# Patient Record
Sex: Female | Born: 1960
Health system: Southern US, Community
[De-identification: ages and names within clinical notes are randomized; demographics above are authoritative.]

## PROBLEM LIST (undated history)

## (undated) DIAGNOSIS — Z87442 Personal history of urinary calculi: Secondary | ICD-10-CM

## (undated) DIAGNOSIS — Z8489 Family history of other specified conditions: Secondary | ICD-10-CM

---

## 1993-07-16 HISTORY — PX: TUBAL LIGATION: SHX77

## 1994-07-16 HISTORY — PX: CHOLECYSTECTOMY: SHX55

## 2000-09-18 ENCOUNTER — Encounter: Payer: Self-pay | Admitting: *Deleted

## 2000-09-18 ENCOUNTER — Ambulatory Visit (HOSPITAL_COMMUNITY): Admission: RE | Admit: 2000-09-18 | Discharge: 2000-09-18 | Payer: Self-pay | Admitting: *Deleted

## 2001-05-08 ENCOUNTER — Ambulatory Visit (HOSPITAL_COMMUNITY): Admission: RE | Admit: 2001-05-08 | Discharge: 2001-05-08 | Payer: Self-pay | Admitting: *Deleted

## 2001-05-08 ENCOUNTER — Encounter: Payer: Self-pay | Admitting: *Deleted

## 2001-08-18 ENCOUNTER — Other Ambulatory Visit: Admission: RE | Admit: 2001-08-18 | Discharge: 2001-08-18 | Payer: Self-pay | Admitting: Obstetrics and Gynecology

## 2002-10-21 ENCOUNTER — Other Ambulatory Visit: Admission: RE | Admit: 2002-10-21 | Discharge: 2002-10-21 | Payer: Self-pay | Admitting: Obstetrics and Gynecology

## 2003-11-01 ENCOUNTER — Other Ambulatory Visit: Admission: RE | Admit: 2003-11-01 | Discharge: 2003-11-01 | Payer: Self-pay | Admitting: Obstetrics and Gynecology

## 2004-03-23 ENCOUNTER — Ambulatory Visit (HOSPITAL_COMMUNITY): Admission: RE | Admit: 2004-03-23 | Discharge: 2004-03-23 | Payer: Self-pay | Admitting: *Deleted

## 2004-03-23 ENCOUNTER — Encounter (INDEPENDENT_AMBULATORY_CARE_PROVIDER_SITE_OTHER): Payer: Self-pay | Admitting: Specialist

## 2004-03-28 ENCOUNTER — Encounter: Admission: RE | Admit: 2004-03-28 | Discharge: 2004-03-28 | Payer: Self-pay | Admitting: *Deleted

## 2004-04-13 ENCOUNTER — Ambulatory Visit (HOSPITAL_COMMUNITY): Admission: RE | Admit: 2004-04-13 | Discharge: 2004-04-13 | Payer: Self-pay | Admitting: Urology

## 2004-07-06 ENCOUNTER — Encounter: Admission: RE | Admit: 2004-07-06 | Discharge: 2004-07-06 | Payer: Self-pay | Admitting: Obstetrics and Gynecology

## 2004-12-15 ENCOUNTER — Other Ambulatory Visit: Admission: RE | Admit: 2004-12-15 | Discharge: 2004-12-15 | Payer: Self-pay | Admitting: Obstetrics and Gynecology

## 2005-01-01 ENCOUNTER — Encounter: Admission: RE | Admit: 2005-01-01 | Discharge: 2005-01-01 | Payer: Self-pay | Admitting: Obstetrics and Gynecology

## 2006-02-20 ENCOUNTER — Other Ambulatory Visit: Admission: RE | Admit: 2006-02-20 | Discharge: 2006-02-20 | Payer: Self-pay | Admitting: Obstetrics and Gynecology

## 2006-03-08 ENCOUNTER — Encounter: Admission: RE | Admit: 2006-03-08 | Discharge: 2006-03-08 | Payer: Self-pay | Admitting: Obstetrics and Gynecology

## 2007-03-13 ENCOUNTER — Emergency Department (HOSPITAL_COMMUNITY): Admission: EM | Admit: 2007-03-13 | Discharge: 2007-03-14 | Payer: Self-pay | Admitting: Emergency Medicine

## 2007-03-14 ENCOUNTER — Inpatient Hospital Stay (HOSPITAL_COMMUNITY): Admission: AD | Admit: 2007-03-14 | Discharge: 2007-03-17 | Payer: Self-pay | Admitting: *Deleted

## 2007-03-14 ENCOUNTER — Ambulatory Visit: Payer: Self-pay | Admitting: *Deleted

## 2007-04-10 ENCOUNTER — Ambulatory Visit (HOSPITAL_COMMUNITY): Payer: Self-pay | Admitting: *Deleted

## 2007-04-22 ENCOUNTER — Ambulatory Visit (HOSPITAL_COMMUNITY): Payer: Self-pay | Admitting: *Deleted

## 2008-06-16 ENCOUNTER — Encounter: Admission: RE | Admit: 2008-06-16 | Discharge: 2008-06-16 | Payer: Self-pay | Admitting: Obstetrics and Gynecology

## 2009-08-05 ENCOUNTER — Encounter: Admission: RE | Admit: 2009-08-05 | Discharge: 2009-08-05 | Payer: Self-pay | Admitting: Obstetrics and Gynecology

## 2010-07-16 HISTORY — PX: SHOULDER ARTHROSCOPY W/ SUBACROMIAL DECOMPRESSION AND DISTAL CLAVICLE EXCISION: SHX2401

## 2010-08-06 ENCOUNTER — Encounter: Payer: Self-pay | Admitting: Obstetrics and Gynecology

## 2010-10-24 ENCOUNTER — Other Ambulatory Visit: Payer: Self-pay | Admitting: Obstetrics and Gynecology

## 2010-10-24 DIAGNOSIS — Z1231 Encounter for screening mammogram for malignant neoplasm of breast: Secondary | ICD-10-CM

## 2010-11-03 ENCOUNTER — Ambulatory Visit
Admission: RE | Admit: 2010-11-03 | Discharge: 2010-11-03 | Disposition: A | Discharge status: Home / Self Care | Payer: BC Managed Care – PPO | Source: Ambulatory Visit | Attending: Obstetrics and Gynecology | Admitting: Obstetrics and Gynecology

## 2010-11-03 DIAGNOSIS — Z1231 Encounter for screening mammogram for malignant neoplasm of breast: Secondary | ICD-10-CM
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
## 2010-11-28 NOTE — Discharge Summary (Signed)
[name], [name]            ACCOUNT NO.:  [id number]   MEDICAL RECORD NO.:  [id number]          PATIENT TYPE:  EMS   LOCATION:  ED                           FACILITY[facility]   PHYSICIAN:  Jasmine Pang, M.D. DATE OF BIRTH:  05-Jun-1961   DATE OF ADMISSION:  03/13/2007  DATE OF DISCHARGE:  03/17/2007                               DISCHARGE SUMMARY   DISCHARGE DIAGNOSES:   AXIS I:  Psychotic disorder, not otherwise specified.   AXIS II:  None.   AXIS III:  Lactose intolerance.   AXIS IV:  Moderate.  (Burden of psychiatric illness, problems with  primary support group).   AXIS V:  Global Assessment of Functioning was 45 on discharge.  Global  Assessment of Functioning was 25 upon admission.  Global Assessment of  Functioning highest past year 75-80.   IDENTIFYING INFORMATION:  A 50 year old married white female who was  admitted on a voluntary basis on March 13, 2007.   HISTORY OF PRESENT ILLNESS:  The patient was brought to the ED by her  husband.  She had had 1 week of increasing paranoid behavior.  She was  concerned that the cable man was wiring the house.  She presented to the  ED complaining of a mental breakdown.  She endorses delusional  thinking for 5 days.  She felt the cable man had wired their house and  thought husband had an affair.  She also thought that husband had an  affair which was not the case.  She was not sleeping very well.  Appetite was decreased.  She noticed her thoughts were disorganized and  she had trouble completing sentences.  She was having loose  associations.  She does not have mental health history.  She denies any  stressors other than the worry that her husband might have been having  an affair.  She denies suicidal or homicidal ideation.  A CT scan was  negative.  She has been on Lexapro in the past by primary care  physician..  The patient denies any drug or alcohol abuse.  She does  have irritable bowel syndrome.  She has had a  bilateral tubal ligation.  She is not on any medications now.  She is allergic to amoxicillin,  Septra and codeine.   PHYSICAL EXAMINATION:  This was done in the ED prior to admission.  There were no acute physical problems.   On admission laboratories were done in the ED prior to admission, see  chart for the results.  No acute abnormalities noted.   HOSPITAL COURSE:  Upon admission the patient was given Risperdal 2 mg  p.o. at bedtime times one.  On March 14, 2007 she was started on  Risperdal 0.25 mg p.o. now then 0.25 mg p.o. q. a.m. and 0.5 mg p.o. at  bedtime.  She was also started on alprazolam 0.5 mg p.o. q.6h. p.r.n.  anxiety, as well as 0.5 mg p.o. at bedtime.  She was started on  benztropine 2 mg p.o. q.8h. p.r.n. EPS or dystonia.  A TSH was ordered  which was within normal limits.  Urinalysis was  negative.  Hepatic  function was remarkable for slightly elevated SGPT of 48 (0-35) and a  slightly increased indirect bilirubin of 1.1 (0.3-0.9).  Urine drug  screen was negative.  Basic metabolic panel was within normal limits  except for a slightly elevated glucose of 119.  Alcohol level was less  than 5.  The CBC with differential was remarkable for slightly elevated  WBC count of 11.2 (4-10.5) and an elevated platelet count of 475 (150-  400).  On March 15, 2007 the patient's Risperdal was changed to 1.5 mg  M tab p.o. q.6h. p.r.n. agitation and psychosis.  On March 16, 2007 the  patient was placed on one-to-one precautions for increasing psychosis  and confusion.  On March 16, 2007 Risperdal was increased to 0.5 mg  p.o. b.i.d. and 2 mg at bedtime.  Xanax was increased to 1 mg p.o. at  bedtime.  On March 17, 2007 one-to-one observation was discontinued  due to improvement in the patient's psychosis and confusion.  During the  hospitalization the patient was disorganized with fragmented thinking.  She had a lot of thought blocking.  She was also suspicious that others   were talking about her.  She was depressed and anxious.  She improved  when her family was here markedly but was very frightened of the  inpatient unit and regressed when they were gone.  Her husband asked for  the possibility of taking her home and having family with her at all  times.  He felt she would be much less scared in the home setting.  We  decided that she could go home and I would monitor her progress until we  could get her in with another psychiatrist.  On March 17, 2007 the  patient's mental status had improved.  She was friendly and cooperative.  She had good eye contact.  Speech was normal rate and flow.  Psychomotor  activity was within normal limits.  Mood was still somewhat anxious and  depressed but improved.  Affect able to smile.  No suicidal or homicidal  ideation.  No thoughts of self injurious behavior.  No paranoia.  No  obvious auditory or visual hallucinations.  No obvious paranoia or  delusions.  Thoughts still were somewhat disorganized but improved from  admission status.  Thought content, no predominant theme.  Cognitive was  grossly within normal limits so she was experiencing some thought  blocking.  It was felt the patient would be better off at home with her  family.  Husband stated that he and her extended family members planned  to be with her at all times and would consult with me about her progress  until we could get her in to see a psychiatrist outpatient.  He also  agreed that he would bring her back to the hospital if her condition  worsened.   DISCHARGE PLANS:  There were no activity level or dietary restrictions.   POST HOSPITAL CARE PLANS:  The patient will follow up with me until she  can get a scheduled appointment with her psychiatrist.  Case manager is  arranging for follow up psychiatric appointment now.   DISCHARGE MEDICATIONS:  1. Risperdal 0.5 mg twice daily and 2 mg at bedtime.  2. Xanax 1 mg at bedtime and 0.5 mg q.6h. p.r.n.  anxiety.  3. Ambien 10 mg p.o. at bedtime p.r.n. insomnia.      Jasmine Pang, M.D.  Electronically Signed     BHS/MEDQ  D:  03/17/2007  T:  03/17/2007  Job:  [id number]

## 2011-04-27 LAB — DIFFERENTIAL
Basophils Absolute: 0
Basophils Relative: 0
Lymphs Abs: 2.2
Monocytes Absolute: 0.7
Monocytes Relative: 6

## 2011-04-27 LAB — CBC
Hemoglobin: 12.8
MCV: 89.1
RBC: 4.2

## 2011-04-27 LAB — BASIC METABOLIC PANEL
Chloride: 107
Creatinine, Ser: 0.74
GFR calc Af Amer: >60
GFR calc non Af Amer: >60
Glucose, Bld: 119 — ABNORMAL HIGH

## 2011-04-27 LAB — HEPATIC FUNCTION PANEL
AST: 32
Bilirubin, Direct: 0.1
Indirect Bilirubin: 1.1 — ABNORMAL HIGH
Total Protein: 7.8

## 2011-04-27 LAB — URINALYSIS, ROUTINE W REFLEX MICROSCOPIC
Hgb urine dipstick: NEGATIVE
Ketones, ur: >80 — AB
Protein, ur: NEGATIVE
Specific Gravity, Urine: 1.03
Urobilinogen, UA: 0.2
pH: 6

## 2011-04-27 LAB — RAPID URINE DRUG SCREEN, HOSP PERFORMED
Barbiturates: NOT DETECTED
Cocaine: NOT DETECTED
Opiates: NOT DETECTED

## 2012-03-06 ENCOUNTER — Other Ambulatory Visit: Payer: Self-pay | Admitting: Obstetrics and Gynecology

## 2012-03-06 DIAGNOSIS — Z1231 Encounter for screening mammogram for malignant neoplasm of breast: Secondary | ICD-10-CM

## 2012-04-07 ENCOUNTER — Ambulatory Visit: Payer: BC Managed Care – PPO

## 2012-04-15 ENCOUNTER — Ambulatory Visit
Admission: RE | Admit: 2012-04-15 | Discharge: 2012-04-15 | Disposition: A | Discharge status: Home / Self Care | Payer: BC Managed Care – PPO | Source: Ambulatory Visit | Attending: Obstetrics and Gynecology | Admitting: Obstetrics and Gynecology

## 2012-04-15 DIAGNOSIS — Z1231 Encounter for screening mammogram for malignant neoplasm of breast: Secondary | ICD-10-CM

## 2015-01-12 ENCOUNTER — Other Ambulatory Visit: Payer: Self-pay | Admitting: Orthopedic Surgery

## 2015-02-17 ENCOUNTER — Encounter (HOSPITAL_BASED_OUTPATIENT_CLINIC_OR_DEPARTMENT_OTHER): Admission: RE | Payer: Self-pay | Source: Ambulatory Visit

## 2015-02-17 ENCOUNTER — Ambulatory Visit (HOSPITAL_BASED_OUTPATIENT_CLINIC_OR_DEPARTMENT_OTHER): Admission: RE | Admit: 2015-02-17 | Payer: Self-pay | Source: Ambulatory Visit | Admitting: Orthopedic Surgery

## 2015-02-17 SURGERY — EXCISION MASS
Anesthesia: Choice | Site: Thumb | Laterality: Right

## 2015-04-06 ENCOUNTER — Other Ambulatory Visit: Payer: Self-pay | Admitting: Family Medicine

## 2015-04-06 ENCOUNTER — Ambulatory Visit
Admission: RE | Admit: 2015-04-06 | Discharge: 2015-04-06 | Disposition: A | Discharge status: Home / Self Care | Payer: BLUE CROSS/BLUE SHIELD | Source: Ambulatory Visit | Attending: Family Medicine | Admitting: Family Medicine

## 2015-04-06 DIAGNOSIS — M79674 Pain in right toe(s): Secondary | ICD-10-CM

## 2016-07-11 DIAGNOSIS — Z23 Encounter for immunization: Secondary | ICD-10-CM | POA: Diagnosis not present

## 2016-07-11 DIAGNOSIS — R109 Unspecified abdominal pain: Secondary | ICD-10-CM | POA: Diagnosis not present

## 2016-07-11 DIAGNOSIS — K900 Celiac disease: Secondary | ICD-10-CM | POA: Diagnosis not present

## 2016-07-17 DIAGNOSIS — Z01419 Encounter for gynecological examination (general) (routine) without abnormal findings: Secondary | ICD-10-CM | POA: Diagnosis not present

## 2016-07-17 DIAGNOSIS — Z6827 Body mass index (BMI) 27.0-27.9, adult: Secondary | ICD-10-CM | POA: Diagnosis not present

## 2016-07-17 DIAGNOSIS — Z1231 Encounter for screening mammogram for malignant neoplasm of breast: Secondary | ICD-10-CM | POA: Diagnosis not present

## 2016-07-17 DIAGNOSIS — Z130 Encounter for screening for diseases of the blood and blood-forming organs and certain disorders involving the immune mechanism: Secondary | ICD-10-CM | POA: Diagnosis not present

## 2016-07-17 DIAGNOSIS — Z1389 Encounter for screening for other disorder: Secondary | ICD-10-CM | POA: Diagnosis not present

## 2016-07-18 DIAGNOSIS — L821 Other seborrheic keratosis: Secondary | ICD-10-CM | POA: Diagnosis not present

## 2016-07-18 DIAGNOSIS — L700 Acne vulgaris: Secondary | ICD-10-CM | POA: Diagnosis not present

## 2016-07-18 DIAGNOSIS — Z1283 Encounter for screening for malignant neoplasm of skin: Secondary | ICD-10-CM | POA: Diagnosis not present

## 2016-07-19 DIAGNOSIS — Z0000 Encounter for general adult medical examination without abnormal findings: Secondary | ICD-10-CM | POA: Diagnosis not present

## 2016-07-24 ENCOUNTER — Other Ambulatory Visit: Payer: Self-pay | Admitting: Obstetrics and Gynecology

## 2016-07-24 DIAGNOSIS — Z1231 Encounter for screening mammogram for malignant neoplasm of breast: Secondary | ICD-10-CM

## 2016-07-24 DIAGNOSIS — R928 Other abnormal and inconclusive findings on diagnostic imaging of breast: Secondary | ICD-10-CM

## 2016-07-25 ENCOUNTER — Ambulatory Visit
Admission: RE | Admit: 2016-07-25 | Discharge: 2016-07-25 | Disposition: A | Discharge status: Home / Self Care | Payer: BLUE CROSS/BLUE SHIELD | Source: Ambulatory Visit | Attending: Obstetrics and Gynecology | Admitting: Obstetrics and Gynecology

## 2016-07-25 ENCOUNTER — Other Ambulatory Visit: Payer: Self-pay | Admitting: Obstetrics and Gynecology

## 2016-07-25 DIAGNOSIS — R921 Mammographic calcification found on diagnostic imaging of breast: Secondary | ICD-10-CM | POA: Diagnosis not present

## 2016-07-25 DIAGNOSIS — R928 Other abnormal and inconclusive findings on diagnostic imaging of breast: Secondary | ICD-10-CM

## 2016-07-27 ENCOUNTER — Ambulatory Visit
Admission: RE | Admit: 2016-07-27 | Discharge: 2016-07-27 | Disposition: A | Discharge status: Home / Self Care | Payer: BLUE CROSS/BLUE SHIELD | Source: Ambulatory Visit | Attending: Obstetrics and Gynecology | Admitting: Obstetrics and Gynecology

## 2016-07-27 DIAGNOSIS — R921 Mammographic calcification found on diagnostic imaging of breast: Secondary | ICD-10-CM | POA: Diagnosis not present

## 2016-07-27 DIAGNOSIS — N6011 Diffuse cystic mastopathy of right breast: Secondary | ICD-10-CM | POA: Diagnosis not present

## 2016-07-27 DIAGNOSIS — R928 Other abnormal and inconclusive findings on diagnostic imaging of breast: Secondary | ICD-10-CM

## 2016-07-31 DIAGNOSIS — J069 Acute upper respiratory infection, unspecified: Secondary | ICD-10-CM | POA: Diagnosis not present

## 2016-08-06 DIAGNOSIS — J209 Acute bronchitis, unspecified: Secondary | ICD-10-CM | POA: Diagnosis not present

## 2016-08-10 ENCOUNTER — Ambulatory Visit: Payer: Self-pay | Admitting: General Surgery

## 2016-08-10 ENCOUNTER — Other Ambulatory Visit: Payer: Self-pay | Admitting: General Surgery

## 2016-08-10 DIAGNOSIS — N6091 Unspecified benign mammary dysplasia of right breast: Secondary | ICD-10-CM

## 2016-08-20 DIAGNOSIS — R7989 Other specified abnormal findings of blood chemistry: Secondary | ICD-10-CM | POA: Diagnosis not present

## 2016-08-27 ENCOUNTER — Encounter (HOSPITAL_COMMUNITY)
Admission: RE | Admit: 2016-08-27 | Discharge: 2016-08-27 | Disposition: A | Discharge status: Home / Self Care | Payer: BLUE CROSS/BLUE SHIELD | Source: Ambulatory Visit | Attending: General Surgery | Admitting: General Surgery

## 2016-08-27 ENCOUNTER — Encounter (HOSPITAL_COMMUNITY): Payer: Self-pay | Admitting: *Deleted

## 2016-08-27 DIAGNOSIS — Z823 Family history of stroke: Secondary | ICD-10-CM | POA: Diagnosis not present

## 2016-08-27 DIAGNOSIS — N6011 Diffuse cystic mastopathy of right breast: Secondary | ICD-10-CM | POA: Diagnosis not present

## 2016-08-27 DIAGNOSIS — Z01812 Encounter for preprocedural laboratory examination: Secondary | ICD-10-CM

## 2016-08-27 DIAGNOSIS — N6081 Other benign mammary dysplasias of right breast: Secondary | ICD-10-CM | POA: Diagnosis not present

## 2016-08-27 DIAGNOSIS — Z8261 Family history of arthritis: Secondary | ICD-10-CM | POA: Diagnosis not present

## 2016-08-27 DIAGNOSIS — Z79899 Other long term (current) drug therapy: Secondary | ICD-10-CM | POA: Diagnosis not present

## 2016-08-27 DIAGNOSIS — N62 Hypertrophy of breast: Secondary | ICD-10-CM

## 2016-08-27 DIAGNOSIS — N63 Unspecified lump in unspecified breast: Secondary | ICD-10-CM | POA: Diagnosis present

## 2016-08-27 DIAGNOSIS — Z8249 Family history of ischemic heart disease and other diseases of the circulatory system: Secondary | ICD-10-CM | POA: Diagnosis not present

## 2016-08-27 DIAGNOSIS — Z833 Family history of diabetes mellitus: Secondary | ICD-10-CM | POA: Diagnosis not present

## 2016-08-27 DIAGNOSIS — Z885 Allergy status to narcotic agent status: Secondary | ICD-10-CM | POA: Diagnosis not present

## 2016-08-27 DIAGNOSIS — Z9889 Other specified postprocedural states: Secondary | ICD-10-CM | POA: Diagnosis not present

## 2016-08-27 DIAGNOSIS — Z880 Allergy status to penicillin: Secondary | ICD-10-CM | POA: Diagnosis not present

## 2016-08-27 DIAGNOSIS — Z883 Allergy status to other anti-infective agents status: Secondary | ICD-10-CM | POA: Diagnosis not present

## 2016-08-27 HISTORY — DX: Family history of other specified conditions: Z84.89

## 2016-08-27 HISTORY — DX: Personal history of urinary calculi: Z87.442

## 2016-08-27 LAB — CBC WITH DIFFERENTIAL/PLATELET
BASOS ABS: 0 10*3/uL (ref 0.0–0.1)
BASOS PCT: 1 %
Eosinophils Absolute: 0.1 10*3/uL (ref 0.0–0.7)
Eosinophils Relative: 1 %
HEMATOCRIT: 39.2 % (ref 36.0–46.0)
HEMOGLOBIN: 12.8 g/dL (ref 12.0–15.0)
Lymphocytes Relative: 47 %
Lymphs Abs: 2.9 10*3/uL (ref 0.7–4.0)
MCH: 29.8 pg (ref 26.0–34.0)
MCHC: 32.7 g/dL (ref 30.0–36.0)
MCV: 91.2 fL (ref 78.0–100.0)
Monocytes Absolute: 0.3 10*3/uL (ref 0.1–1.0)
Monocytes Relative: 5 %
NEUTROS ABS: 2.9 10*3/uL (ref 1.7–7.7)
NEUTROS PCT: 46 %
Platelets: 301 10*3/uL (ref 150–400)
RBC: 4.3 MIL/uL (ref 3.87–5.11)
RDW: 13 % (ref 11.5–15.5)
WBC: 6.2 10*3/uL (ref 4.0–10.5)

## 2016-08-27 LAB — BASIC METABOLIC PANEL
Anion gap: 9 (ref 5–15)
BUN: 9 mg/dL (ref 6–20)
CALCIUM: 9.5 mg/dL (ref 8.9–10.3)
CHLORIDE: 105 mmol/L (ref 101–111)
CO2: 26 mmol/L (ref 22–32)
Creatinine, Ser: 0.89 mg/dL (ref 0.44–1.00)
GFR calc non Af Amer: >60 mL/min (ref >60)
Glucose, Bld: 137 mg/dL — ABNORMAL HIGH (ref 65–99)
POTASSIUM: 3.5 mmol/L (ref 3.5–5.1)
Sodium: 140 mmol/L (ref 135–145)

## 2016-08-27 LAB — HCG, SERUM, QUALITATIVE: Preg, Serum: NEGATIVE

## 2016-08-27 NOTE — Pre-Procedure Instructions (Addendum)
Patria ManeRachel C Lisenbee  08/27/2016      [id number] [place] - [address] [facility] [address] [place] [id number] Phone: [phone] Fax: [phone]    Your procedure is scheduled on 08/29/16  [facility] at 830 A.M.  Call this number if you have problems the morning of surgery:  [phone]   Remember:  Do not eat food or drink liquids after midnight.  Take these medicines the morning of surgery with A SIP OF WATER     NONE  STOP all herbel meds, nsaids (aleve,naproxen,advil,ibuprofen)  Starting NOW including vitamins/supplements,aspirin   Do not wear jewelry, make-up or nail polish.  Do not wear lotions, powders, or perfumes, or deoderant.  Do not shave 48 hours prior to surgery.  Men may shave face and neck.  Do not bring valuables to the hospital.  [facility] is not responsible for any belongings or valuables.  Contacts, dentures or bridgework may not be worn into surgery.  Leave your suitcase in the car.  After surgery it may be brought to your room.  For patients admitted to the hospital, discharge time will be determined by your treatment team.  Patients discharged the day of surgery will not be allowed to drive home.   Special instructions:   Special Instructions: [place] - Preparing for Surgery  Before surgery, you can play an important role.  Because skin is not sterile, your skin needs to be as free of germs as possible.  You can reduce the number of germs on you skin by washing with CHG (chlorahexidine gluconate) soap before surgery.  CHG is an antiseptic cleaner which kills germs and bonds with the skin to continue killing germs even after washing.  Please DO NOT use if you have an allergy to CHG or antibacterial soaps.  If your skin becomes reddened/irritated stop using the CHG and inform your nurse when you arrive at Short Stay.  Do not shave (including legs and  underarms) for at least 48 hours prior to the first CHG shower.  You may shave your face.  Please follow these instructions carefully:   1.  Shower with CHG Soap the night before surgery and the morning of Surgery.  2.  If you choose to wash your hair, wash your hair first as usual with your normal shampoo.  3.  After you shampoo, rinse your hair and body thoroughly to remove the Shampoo.  4.  Use CHG as you would any other liquid soap.  You can apply chg directly  to the skin and wash gently with scrungie or a clean washcloth.  5.  Apply the CHG Soap to your body ONLY FROM THE NECK DOWN.  Do not use on open wounds or open sores.  Avoid contact with your eyes ears, mouth and genitals (private parts).  Wash genitals (private parts)       with your normal soap.  6.  Wash thoroughly, paying special attention to the area where your surgery will be performed.  7.  Thoroughly rinse your body with warm water from the neck down.  8.  DO NOT shower/wash with your normal soap after using and rinsing off the CHG Soap.  9.  Pat yourself dry with a clean towel.            10.  Wear clean pajamas.  11.  Place clean sheets on your bed the night of your first shower and do not sleep with pets.  Day of Surgery  Do not apply any lotions/deodorants the morning of surgery.  Please wear clean clothes to the hospital/surgery center.  Please read over the  fact sheets that you were given.

## 2016-08-28 ENCOUNTER — Ambulatory Visit
Admission: RE | Admit: 2016-08-28 | Discharge: 2016-08-28 | Disposition: A | Discharge status: Home / Self Care | Payer: BLUE CROSS/BLUE SHIELD | Source: Ambulatory Visit | Attending: General Surgery | Admitting: General Surgery

## 2016-08-28 DIAGNOSIS — N62 Hypertrophy of breast: Secondary | ICD-10-CM | POA: Diagnosis not present

## 2016-08-28 DIAGNOSIS — N6091 Unspecified benign mammary dysplasia of right breast: Secondary | ICD-10-CM

## 2016-08-29 ENCOUNTER — Ambulatory Visit (HOSPITAL_COMMUNITY)
Admission: RE | Admit: 2016-08-29 | Discharge: 2016-08-29 | Disposition: A | Discharge status: Home / Self Care | Payer: BLUE CROSS/BLUE SHIELD | Source: Ambulatory Visit | Attending: General Surgery | Admitting: General Surgery

## 2016-08-29 ENCOUNTER — Ambulatory Visit
Admission: RE | Admit: 2016-08-29 | Discharge: 2016-08-29 | Disposition: A | Discharge status: Home / Self Care | Payer: BLUE CROSS/BLUE SHIELD | Source: Ambulatory Visit | Attending: General Surgery | Admitting: General Surgery

## 2016-08-29 ENCOUNTER — Ambulatory Visit (HOSPITAL_COMMUNITY): Payer: BLUE CROSS/BLUE SHIELD | Admitting: Certified Registered Nurse Anesthetist

## 2016-08-29 ENCOUNTER — Encounter (HOSPITAL_COMMUNITY): Payer: Self-pay | Admitting: Urology

## 2016-08-29 ENCOUNTER — Encounter (HOSPITAL_COMMUNITY)
Admission: RE | Disposition: A | Discharge status: Home / Self Care | Payer: Self-pay | Source: Ambulatory Visit | Attending: General Surgery

## 2016-08-29 DIAGNOSIS — R921 Mammographic calcification found on diagnostic imaging of breast: Secondary | ICD-10-CM | POA: Diagnosis not present

## 2016-08-29 DIAGNOSIS — Z833 Family history of diabetes mellitus: Secondary | ICD-10-CM | POA: Insufficient documentation

## 2016-08-29 DIAGNOSIS — Z8261 Family history of arthritis: Secondary | ICD-10-CM | POA: Insufficient documentation

## 2016-08-29 DIAGNOSIS — Z79899 Other long term (current) drug therapy: Secondary | ICD-10-CM | POA: Insufficient documentation

## 2016-08-29 DIAGNOSIS — N6081 Other benign mammary dysplasias of right breast: Secondary | ICD-10-CM | POA: Diagnosis not present

## 2016-08-29 DIAGNOSIS — N6011 Diffuse cystic mastopathy of right breast: Secondary | ICD-10-CM | POA: Insufficient documentation

## 2016-08-29 DIAGNOSIS — D241 Benign neoplasm of right breast: Secondary | ICD-10-CM | POA: Diagnosis not present

## 2016-08-29 DIAGNOSIS — Z885 Allergy status to narcotic agent status: Secondary | ICD-10-CM | POA: Insufficient documentation

## 2016-08-29 DIAGNOSIS — Z9889 Other specified postprocedural states: Secondary | ICD-10-CM | POA: Insufficient documentation

## 2016-08-29 DIAGNOSIS — Z880 Allergy status to penicillin: Secondary | ICD-10-CM | POA: Insufficient documentation

## 2016-08-29 DIAGNOSIS — Z883 Allergy status to other anti-infective agents status: Secondary | ICD-10-CM | POA: Insufficient documentation

## 2016-08-29 DIAGNOSIS — N62 Hypertrophy of breast: Secondary | ICD-10-CM | POA: Diagnosis not present

## 2016-08-29 DIAGNOSIS — Z8249 Family history of ischemic heart disease and other diseases of the circulatory system: Secondary | ICD-10-CM | POA: Insufficient documentation

## 2016-08-29 DIAGNOSIS — N6091 Unspecified benign mammary dysplasia of right breast: Secondary | ICD-10-CM

## 2016-08-29 DIAGNOSIS — Z823 Family history of stroke: Secondary | ICD-10-CM | POA: Insufficient documentation

## 2016-08-29 HISTORY — PX: BREAST LUMPECTOMY WITH RADIOACTIVE SEED LOCALIZATION: SHX6424

## 2016-08-29 SURGERY — BREAST LUMPECTOMY WITH RADIOACTIVE SEED LOCALIZATION
Anesthesia: General | Site: Breast | Laterality: Right

## 2016-08-29 MED ORDER — DEXAMETHASONE SODIUM PHOSPHATE 10 MG/ML IJ SOLN
INTRAMUSCULAR | Status: DC | PRN
  Administered 2016-08-29: 5 mg via INTRAVENOUS

## 2016-08-29 MED ORDER — ACETAMINOPHEN 500 MG PO TABS
ORAL_TABLET | ORAL | Status: AC
  Administered 2016-08-29: 1000 mg via ORAL
  Filled 2016-08-29: qty 2

## 2016-08-29 MED ORDER — LIDOCAINE 2% (20 MG/ML) 5 ML SYRINGE
INTRAMUSCULAR | Status: AC
  Filled 2016-08-29: qty 5

## 2016-08-29 MED ORDER — 0.9 % SODIUM CHLORIDE (POUR BTL) OPTIME
TOPICAL | Status: DC | PRN
Start: 2016-08-29 — End: 2016-08-29
  Administered 2016-08-29: 1000 mL

## 2016-08-29 MED ORDER — CHLORHEXIDINE GLUCONATE CLOTH 2 % EX PADS: 6.0000 | MEDICATED_PAD | Freq: Once | CUTANEOUS | Status: DC

## 2016-08-29 MED ORDER — VANCOMYCIN HCL 1000 MG IV SOLR
INTRAVENOUS | Status: DC | PRN
  Administered 2016-08-29: 1000 mg via INTRAVENOUS

## 2016-08-29 MED ORDER — DIPHENHYDRAMINE HCL 50 MG/ML IJ SOLN
INTRAMUSCULAR | Status: DC | PRN
Start: 2016-08-29 — End: 2016-08-29
  Administered 2016-08-29: 25 mg via INTRAVENOUS

## 2016-08-29 MED ORDER — ACETAMINOPHEN 500 MG PO TABS
1000.0000 mg | ORAL_TABLET | ORAL | Status: AC
  Administered 2016-08-29: 1000 mg via ORAL

## 2016-08-29 MED ORDER — BUPIVACAINE HCL 0.25 % IJ SOLN
INTRAMUSCULAR | Status: DC | PRN
  Administered 2016-08-29: 20 mL

## 2016-08-29 MED ORDER — LACTATED RINGERS IV SOLN
INTRAVENOUS | Status: DC
  Administered 2016-08-29: 09:00:00 via INTRAVENOUS

## 2016-08-29 MED ORDER — ONDANSETRON HCL 4 MG/2ML IJ SOLN
INTRAMUSCULAR | Status: DC | PRN
  Administered 2016-08-29: 4 mg via INTRAVENOUS

## 2016-08-29 MED ORDER — MIDAZOLAM HCL 2 MG/2ML IJ SOLN
INTRAMUSCULAR | Status: AC
  Filled 2016-08-29: qty 2

## 2016-08-29 MED ORDER — ONDANSETRON HCL 4 MG/2ML IJ SOLN
INTRAMUSCULAR | Status: AC
  Filled 2016-08-29: qty 2

## 2016-08-29 MED ORDER — BUPIVACAINE HCL (PF) 0.25 % IJ SOLN
INTRAMUSCULAR | Status: AC
  Filled 2016-08-29: qty 30

## 2016-08-29 MED ORDER — FENTANYL CITRATE (PF) 100 MCG/2ML IJ SOLN
25.0000 ug | INTRAMUSCULAR | Status: DC | PRN
  Administered 2016-08-29: 25 ug via INTRAVENOUS

## 2016-08-29 MED ORDER — FENTANYL CITRATE (PF) 100 MCG/2ML IJ SOLN
INTRAMUSCULAR | Status: AC
  Filled 2016-08-29: qty 2

## 2016-08-29 MED ORDER — PROPOFOL 10 MG/ML IV BOLUS
INTRAVENOUS | Status: AC
  Filled 2016-08-29: qty 20

## 2016-08-29 MED ORDER — TRAMADOL HCL 50 MG PO TABS
50.0000 mg | ORAL_TABLET | Freq: Once | ORAL | Status: AC
  Administered 2016-08-29: 50 mg via ORAL

## 2016-08-29 MED ORDER — PROPOFOL 10 MG/ML IV BOLUS
INTRAVENOUS | Status: DC | PRN
  Administered 2016-08-29: 200 mg via INTRAVENOUS

## 2016-08-29 MED ORDER — TRAMADOL HCL 50 MG PO TABS: 50.0000 mg | ORAL_TABLET | Freq: Four times a day (QID) | ORAL | 1 refills | Status: DC | PRN

## 2016-08-29 MED ORDER — TRAMADOL HCL 50 MG PO TABS
ORAL_TABLET | ORAL | Status: AC
  Filled 2016-08-29: qty 1

## 2016-08-29 MED ORDER — FENTANYL CITRATE (PF) 100 MCG/2ML IJ SOLN
INTRAMUSCULAR | Status: DC | PRN
  Administered 2016-08-29 (×2): 50 ug via INTRAVENOUS

## 2016-08-29 MED ORDER — LIDOCAINE HCL (CARDIAC) 20 MG/ML IV SOLN
INTRAVENOUS | Status: DC | PRN
  Administered 2016-08-29: 100 mg via INTRAVENOUS

## 2016-08-29 MED ORDER — CELECOXIB 200 MG PO CAPS: 400.0000 mg | ORAL_CAPSULE | ORAL | Status: DC

## 2016-08-29 MED ORDER — MEPERIDINE HCL 25 MG/ML IJ SOLN: 6.2500 mg | INTRAMUSCULAR | Status: DC | PRN

## 2016-08-29 MED ORDER — VANCOMYCIN HCL IN DEXTROSE 1-5 GM/200ML-% IV SOLN
INTRAVENOUS | Status: AC
  Filled 2016-08-29: qty 200

## 2016-08-29 MED ORDER — LACTATED RINGERS IV SOLN: INTRAVENOUS | Status: DC

## 2016-08-29 MED ORDER — EPHEDRINE SULFATE 50 MG/ML IJ SOLN
INTRAMUSCULAR | Status: DC | PRN
  Administered 2016-08-29: 10 mg via INTRAVENOUS

## 2016-08-29 MED ORDER — MIDAZOLAM HCL 5 MG/5ML IJ SOLN
INTRAMUSCULAR | Status: DC | PRN
  Administered 2016-08-29: 2 mg via INTRAVENOUS

## 2016-08-29 MED ORDER — GABAPENTIN 300 MG PO CAPS
ORAL_CAPSULE | ORAL | Status: AC
  Administered 2016-08-29: 300 mg via ORAL
  Filled 2016-08-29: qty 1

## 2016-08-29 MED ORDER — GABAPENTIN 300 MG PO CAPS
300.0000 mg | ORAL_CAPSULE | ORAL | Status: AC
  Administered 2016-08-29: 300 mg via ORAL

## 2016-08-29 MED ORDER — HYDROCODONE-ACETAMINOPHEN 5-325 MG PO TABS: 1.0000 | ORAL_TABLET | ORAL | 0 refills | Status: DC | PRN

## 2016-08-29 MED ORDER — METOCLOPRAMIDE HCL 5 MG/ML IJ SOLN: 10.0000 mg | Freq: Once | INTRAMUSCULAR | Status: DC | PRN

## 2016-08-29 SURGICAL SUPPLY — 45 items
APPLIER CLIP 9.375 MED OPEN (MISCELLANEOUS)
BLADE SURG 15 STRL LF DISP TIS (BLADE) ×1 IMPLANT
BLADE SURG 15 STRL SS (BLADE) ×2
CANISTER SUCTION 2500CC (MISCELLANEOUS) ×3 IMPLANT
CHLORAPREP W/TINT 26ML (MISCELLANEOUS) ×3 IMPLANT
CLIP APPLIE 9.375 MED OPEN (MISCELLANEOUS) IMPLANT
CONT SPEC 4OZ CLIKSEAL STRL BL (MISCELLANEOUS) ×3 IMPLANT
COVER PROBE W GEL 5X96 (DRAPES) ×3 IMPLANT
COVER SURGICAL LIGHT HANDLE (MISCELLANEOUS) ×3 IMPLANT
DERMABOND ADVANCED (GAUZE/BANDAGES/DRESSINGS) ×2
DERMABOND ADVANCED .7 DNX12 (GAUZE/BANDAGES/DRESSINGS) ×1 IMPLANT
DEVICE DUBIN SPECIMEN MAMMOGRA (MISCELLANEOUS) ×3 IMPLANT
DRAPE CHEST BREAST 15X10 FENES (DRAPES) ×3 IMPLANT
DRAPE UTILITY XL STRL (DRAPES) ×3 IMPLANT
ELECT COATED BLADE 2.86 ST (ELECTRODE) ×3 IMPLANT
ELECT REM PT RETURN 9FT ADLT (ELECTROSURGICAL) ×3
ELECTRODE REM PT RTRN 9FT ADLT (ELECTROSURGICAL) ×1 IMPLANT
GLOVE BIO SURGEON STRL SZ7.5 (GLOVE) IMPLANT
GLOVE BIOGEL PI IND STRL 6.5 (GLOVE) ×1 IMPLANT
GLOVE BIOGEL PI IND STRL 7.0 (GLOVE) ×1 IMPLANT
GLOVE BIOGEL PI INDICATOR 6.5 (GLOVE) ×2
GLOVE BIOGEL PI INDICATOR 7.0 (GLOVE) ×2
GLOVE SURG SS PI 6.0 STRL IVOR (GLOVE) ×3 IMPLANT
GLOVE SURG SS PI 7.5 STRL IVOR (GLOVE) ×6 IMPLANT
GOWN STRL REUS W/ TWL LRG LVL3 (GOWN DISPOSABLE) ×2 IMPLANT
GOWN STRL REUS W/TWL LRG LVL3 (GOWN DISPOSABLE) ×4
KIT BASIN OR (CUSTOM PROCEDURE TRAY) ×3 IMPLANT
KIT MARKER MARGIN INK (KITS) ×3 IMPLANT
LIGHT WAVEGUIDE WIDE FLAT (MISCELLANEOUS) ×3 IMPLANT
NEEDLE HYPO 25X1 1.5 SAFETY (NEEDLE) ×3 IMPLANT
NS IRRIG 1000ML POUR BTL (IV SOLUTION) ×3 IMPLANT
PACK SURGICAL SETUP 50X90 (CUSTOM PROCEDURE TRAY) ×3 IMPLANT
PENCIL BUTTON HOLSTER BLD 10FT (ELECTRODE) ×3 IMPLANT
SPONGE LAP 18X18 X RAY DECT (DISPOSABLE) ×3 IMPLANT
SPONGE LAP 4X18 X RAY DECT (DISPOSABLE) ×3 IMPLANT
SUT MNCRL AB 4-0 PS2 18 (SUTURE) ×3 IMPLANT
SUT SILK 2 0 SH (SUTURE) ×3 IMPLANT
SUT VIC AB 3-0 SH 18 (SUTURE) ×3 IMPLANT
SYR BULB 3OZ (MISCELLANEOUS) ×3 IMPLANT
SYR CONTROL 10ML LL (SYRINGE) ×3 IMPLANT
TOWEL OR 17X24 6PK STRL BLUE (TOWEL DISPOSABLE) ×3 IMPLANT
TOWEL OR 17X26 10 PK STRL BLUE (TOWEL DISPOSABLE) IMPLANT
TUBE CONNECTING 12'X1/4 (SUCTIONS) ×1
TUBE CONNECTING 12X1/4 (SUCTIONS) ×2 IMPLANT
YANKAUER SUCT BULB TIP NO VENT (SUCTIONS) ×3 IMPLANT

## 2016-08-29 NOTE — Anesthesia Preprocedure Evaluation (Signed)
Anesthesia Evaluation  Patient identified by MRN, date of birth, ID band Patient awake    Reviewed: Allergy & Precautions, NPO status , Patient's Chart, lab work & pertinent test results  Airway Mallampati: II  TM Distance: >3 FB Neck ROM: Full    Dental no notable dental hx.    Pulmonary neg pulmonary ROS,    Pulmonary exam normal breath sounds clear to auscultation       Cardiovascular negative cardio ROS Normal cardiovascular exam Rhythm:Regular Rate:Normal     Neuro/Psych negative neurological ROS  negative psych ROS   GI/Hepatic negative GI ROS, Neg liver ROS,   Endo/Other  negative endocrine ROS  Renal/GU negative Renal ROS  negative genitourinary   Musculoskeletal negative musculoskeletal ROS (+)   Abdominal   Peds negative pediatric ROS (+)  Hematology negative hematology ROS (+)   Anesthesia Other Findings   Reproductive/Obstetrics negative OB ROS                             Anesthesia Physical Anesthesia Plan  ASA: I  Anesthesia Plan: General   Post-op Pain Management:    Induction: Intravenous  Airway Management Planned: LMA  Additional Equipment:   Intra-op Plan:   Post-operative Plan: Extubation in OR  Informed Consent: I have reviewed the patients History and Physical, chart, labs and discussed the procedure including the risks, benefits and alternatives for the proposed anesthesia with the patient or authorized representative who has indicated his/her understanding and acceptance.   Dental advisory given  Plan Discussed with: CRNA  Anesthesia Plan Comments:         Anesthesia Quick Evaluation  

## 2016-08-29 NOTE — Interval H&P Note (Signed)
History and Physical Interval Note:  08/29/2016 9:39 AM  [name]  has presented today for surgery, with the diagnosis of RIGHT BREAST ALH  The various methods of treatment have been discussed with the patient and family. After consideration of risks, benefits and other options for treatment, the patient has consented to  Procedure(s): BREAST LUMPECTOMY WITH RADIOACTIVE SEED LOCALIZATION (Right) as a surgical intervention .  The patient's history has been reviewed, patient examined, no change in status, stable for surgery.  I have reviewed the patient's chart and labs.  Questions were answered to the patient's satisfaction.     TOTH III,Leahmarie Gasiorowski S

## 2016-08-29 NOTE — Anesthesia Postprocedure Evaluation (Signed)
Anesthesia Post Note  Patient: [name]  Procedure(s) Performed: Procedure(s) (LRB): RADIOACTIVE SEED GUIDED RIGHT BREAST LUMPECTOMY (Right)  Patient location during evaluation: PACU Anesthesia Type: General Level of consciousness: awake and alert Pain management: pain level controlled Vital Signs Assessment: post-procedure vital signs reviewed and stable Respiratory status: spontaneous breathing, nonlabored ventilation, respiratory function stable and patient connected to nasal cannula oxygen Cardiovascular status: blood pressure returned to baseline and stable Postop Assessment: no signs of nausea or vomiting Anesthetic complications: no       Last Vitals:  Vitals:   08/29/16 1230 08/29/16 1252  BP:  137/78  Pulse: (!) 50 71  Resp: 13 16  Temp:      Last Pain:  Vitals:   08/29/16 1225  TempSrc:   PainSc: 6                  Phillips Groutarignan, Brinsley Wence

## 2016-08-29 NOTE — Op Note (Signed)
08/29/2016  11:22 AM  PATIENT:  [name]  56 y.o. female  PRE-OPERATIVE DIAGNOSIS:  RIGHT BREAST ATYPICAL LOBULAR HYPERPLASIA   POST-OPERATIVE DIAGNOSIS:  RIGHT BREAST ATYPICAL LOBULAR HYPERPLASIA   PROCEDURE:  Procedure(s): RADIOACTIVE SEED GUIDED RIGHT BREAST LUMPECTOMY (Right)  SURGEON:  Surgeon(s) and Role:    * Griselda MinerPaul Toth III, MD - Primary  PHYSICIAN ASSISTANT:   ASSISTANTS: none   ANESTHESIA:   local and general  EBL:  No intake/output data recorded.  BLOOD ADMINISTERED:none  DRAINS: none   LOCAL MEDICATIONS USED:  MARCAINE     SPECIMEN:  Source of Specimen:  right breast tissue  DISPOSITION OF SPECIMEN:  PATHOLOGY  COUNTS:  YES  TOURNIQUET:  * No tourniquets in log *  DICTATION: .Dragon Dictation   After informed consent was obtained the patient was brought to the operating room and placed in the supine position on the operating room table. After adequate induction of general anesthesia the patient's right breast was prepped with ChloraPrep, allowed to dry, and draped in usual sterile manner. An appropriate timeout was performed. Previously an I-125 seed was placed in the upper portion of the right breast marked an area of atypical lobular hyperplasia. The neoprobe was set to I-125 in the area of radioactivity was readily identified. A curvilinear incision was made with a 15 blade knife along the upper edge of the areola. The incision was carried through the skin and subcutaneous tissue sharply with the electrocautery. The neoprobe was used to direct sharp and Bovie dissection into the upper portion of the right breast. Once I approached the radioactive seed I then removed a circular portion of breast tissue around the radioactive seed while checking the area of radioactivity frequently with the neoprobe. Once the specimen was removed it was oriented with the appropriate paint colors. A specimen radiograph was obtained that showed the clip in seed being near the  upper edge of the specimen. The specimen was then sent to pathology for further evaluation. An additional superior margin was taken sharply with electrocautery and marked appropriately. The wound was then irrigated with copious amounts of saline and infiltrated with quarter percent Marcaine. The deep layer of the wound was then closed with layers of interrupted 3-0 Vicryl stitches. The skin was then closed with interrupted 4-0 Monocryl subcuticular stitches. Dermabond dressings were applied. The patient tolerated the procedure well. At the end of the case all needle sponge and instrument counts were correct. The patient was then awakened and taken to recovery in stable condition.  PLAN OF CARE: Discharge to home after PACU  PATIENT DISPOSITION:  PACU - hemodynamically stable.   Delay start of Pharmacological VTE agent (>24hrs) due to surgical blood loss or risk of bleeding: not applicable

## 2016-08-29 NOTE — Progress Notes (Signed)
Pt with allergy to PCN and septra. Per pharmacy do not give celebrex d/t possible reaction d/t allergy to septra. Note left for Dr. Carolynne Edouardoth.

## 2016-08-29 NOTE — Anesthesia Procedure Notes (Signed)
Procedure Name: LMA Insertion Date/Time: 08/29/2016 10:14 AM Performed by: Samara DeistBECKNER, Darling Cieslewicz B Pre-anesthesia Checklist: Patient identified, Emergency Drugs available, Suction available, Patient being monitored and Timeout performed Patient Re-evaluated:Patient Re-evaluated prior to inductionOxygen Delivery Method: Circle system utilized Preoxygenation: Pre-oxygenation with 100% oxygen Intubation Type: IV induction LMA: LMA inserted LMA Size: 4.0 Number of attempts: 1 Placement Confirmation: positive ETCO2 and breath sounds checked- equal and bilateral Tube secured with: Tape Dental Injury: Teeth and Oropharynx as per pre-operative assessment

## 2016-08-29 NOTE — Transfer of Care (Signed)
Immediate Anesthesia Transfer of Care Note  Patient: [name]  Procedure(s) Performed: Procedure(s): RADIOACTIVE SEED GUIDED RIGHT BREAST LUMPECTOMY (Right)  Patient Location: PACU  Anesthesia Type:General  Level of Consciousness: sedated  Airway & Oxygen Therapy: Patient Spontanous Breathing and Patient connected to nasal cannula oxygen  Post-op Assessment: Report given to RN and Post -op Vital signs reviewed and stable  Post vital signs: Reviewed and stable  Last Vitals:  Vitals:   08/29/16 1137 08/29/16 1139  BP:  (!) 116/59  Pulse: 60 61  Resp:    Temp: 36.2 C     Last Pain:  Vitals:   08/29/16 0853  TempSrc: Oral         Complications: No apparent anesthesia complications

## 2016-08-29 NOTE — H&P (Signed)
[name]  Location: [facility] Patient #: [id number] DOB: Aug 05, 1960 Married / Language: English / Race: White Female   History of Present Illness  The patient is a 56 year old female who presents with a breast mass. We are asked to see the patient in consultation by Dr. Georgianne Fick to evaluate her for a right breast atypical lobular hyperplasia. The patient is a 56 year old white female who recently went for a routine screening mammogram. At that time she was found to have a 1.2 cm area of abnormal calcification in the upper portion of the right breast. This was biopsied and came back as atypical lobular hyperplasia. She denies any breast pain or discharge from the nipple. She is a nonsmoker. She does not take any hormone replacement. She has no family history of breast cancer. She is otherwise healthy.   Past Surgical History  Breast Biopsy  Right. Gallbladder Surgery - Laparoscopic  Shoulder Surgery  Right.  Diagnostic Studies History Colonoscopy  >10 years ago Mammogram  within last year Pap Smear  1-5 years ago  Allergies  Amoxicillin *PENICILLINS*  Swelling, shortness of breath Septra *ANTI-INFECTIVE AGENTS - MISC.*  swelling and shortness of breath Codeine Phosphate *ANALGESICS - OPIOID*  nausea and vomiting Allergies Reconciled   Medication History Azithromycin (250MG  Tablet, Oral daily) Active. Benzonatate (100MG  Capsule, Oral daily) Active. Mucinex (600MG  Tablet ER, Oral daily) Active. Multi-Minerals (Oral daily) Active. Medications Reconciled  Social History  Alcohol use  Occasional alcohol use. Caffeine use  Tea. No drug use  Tobacco use  Never smoker.  Family History Anesthetic complications  Mother. Arthritis  Family Members In General, Father. Cerebrovascular Accident  Family Members In General, Father. Diabetes Mellitus  Family Members In General. Heart Disease  Family Members In General,  Father. Hypertension  Father.  Pregnancy / Birth History  Age at menarche  12 years. Age of menopause  51-55 Contraceptive History  Oral contraceptives. Gravida  2 Irregular periods  Maternal age  4-30 Para  2  Other Problems  Cholelithiasis     Review of Systems  General Not Present- Appetite Loss, Chills, Fatigue, Fever, Night Sweats, Weight Gain and Weight Loss. Skin Not Present- Change in Wart/Mole, Dryness, Hives, Jaundice, New Lesions, Non-Healing Wounds, Rash and Ulcer. HEENT Present- Wears glasses/contact lenses. Not Present- Earache, Hearing Loss, Hoarseness, Nose Bleed, Oral Ulcers, Ringing in the Ears, Seasonal Allergies, Sinus Pain, Sore Throat, Visual Disturbances and Yellow Eyes. Respiratory Not Present- Bloody sputum, Chronic Cough, Difficulty Breathing, Snoring and Wheezing. Breast Not Present- Breast Mass, Breast Pain, Nipple Discharge and Skin Changes. Cardiovascular Not Present- Chest Pain, Difficulty Breathing Lying Down, Leg Cramps, Palpitations, Rapid Heart Rate, Shortness of Breath and Swelling of Extremities. Gastrointestinal Not Present- Abdominal Pain, Bloating, Bloody Stool, Change in Bowel Habits, Chronic diarrhea, Constipation, Difficulty Swallowing, Excessive gas, Gets full quickly at meals, Hemorrhoids, Indigestion, Nausea, Rectal Pain and Vomiting. Female Genitourinary Not Present- Frequency, Nocturia, Painful Urination, Pelvic Pain and Urgency. Musculoskeletal Not Present- Back Pain, Joint Pain, Joint Stiffness, Muscle Pain, Muscle Weakness and Swelling of Extremities. Neurological Not Present- Decreased Memory, Fainting, Headaches, Numbness, Seizures, Tingling, Tremor, Trouble walking and Weakness. Psychiatric Not Present- Anxiety, Bipolar, Change in Sleep Pattern, Depression, Fearful and Frequent crying. Endocrine Not Present- Cold Intolerance, Excessive Hunger, Hair Changes, Heat Intolerance, Hot flashes and New Diabetes. Hematology Not  Present- Blood Thinners, Easy Bruising, Excessive bleeding, Gland problems, HIV and Persistent Infections.  Vitals  Weight: 148.2 lb Height: 62in Body Surface Area: 1.68 m Body  Mass Index: 27.11 kg/m  Temp.: 98.37F  Pulse: 67 (Regular)  BP: 112/62 (Sitting, Left Arm, Standard)       Physical Exam  General Mental Status-Alert. General Appearance-Consistent with stated age. Hydration-Well hydrated. Voice-Normal.  Head and Neck Head-normocephalic, atraumatic with no lesions or palpable masses. Trachea-midline. Thyroid Gland Characteristics - normal size and consistency.  Eye Eyeball - Bilateral-Extraocular movements intact. Sclera/Conjunctiva - Bilateral-No scleral icterus.  Chest and Lung Exam Chest and lung exam reveals -quiet, even and easy respiratory effort with no use of accessory muscles and on auscultation, normal breath sounds, no adventitious sounds and normal vocal resonance. Inspection Chest Wall - Normal. Back - normal.  Breast Note: There is no palpable mass in either breast. There is no palpable axillary, supraclavicular, or cervical lymphadenopathy. There is some mild bruising in the upper portion of the right breast.   Cardiovascular Cardiovascular examination reveals -normal heart sounds, regular rate and rhythm with no murmurs and normal pedal pulses bilaterally.  Abdomen Inspection Inspection of the abdomen reveals - No Hernias. Skin - Scar - no surgical scars. Palpation/Percussion Palpation and Percussion of the abdomen reveal - Soft, Non Tender, No Rebound tenderness, No Rigidity (guarding) and No hepatosplenomegaly. Auscultation Auscultation of the abdomen reveals - Bowel sounds normal.  Neurologic Neurologic evaluation reveals -alert and oriented x 3 with no impairment of recent or remote memory. Mental Status-Normal.  Musculoskeletal Normal Exam - Left-Upper Extremity Strength Normal and Lower  Extremity Strength Normal. Normal Exam - Right-Upper Extremity Strength Normal and Lower Extremity Strength Normal.  Lymphatic Head & Neck  General Head & Neck Lymphatics: Bilateral - Description - Normal. Axillary  General Axillary Region: Bilateral - Description - Normal. Tenderness - Non Tender. Femoral & Inguinal  Generalized Femoral & Inguinal Lymphatics: Bilateral - Description - Normal. Tenderness - Non Tender.    Assessment & Plan ATYPICAL LOBULAR HYPERPLASIA OF RIGHT BREAST (N60.91) Impression: The patient appears to have a small area of atypical lobular hyperplasia in the upper portion of the right breast. Because of its abnormal appearance and because it can be considered a high risk lesion and I would recommend that this area be removed. I have discussed with her in detail the risks and benefits of the operation to do this as well as some of the technical aspects and she understands and wishes to proceed. I will plan for a right breast radioactive seed localized lumpectomy Current Plans Referred to Oncology, for evaluation and follow up (Oncology). Routine. Pt Education - Breast Diseases: discussed with patient and provided information.

## 2016-08-30 ENCOUNTER — Encounter (HOSPITAL_COMMUNITY): Payer: Self-pay | Admitting: General Surgery

## 2016-09-19 ENCOUNTER — Encounter: Payer: Self-pay | Admitting: Oncology

## 2016-10-04 ENCOUNTER — Telehealth: Payer: Self-pay | Admitting: *Deleted

## 2016-10-04 NOTE — Telephone Encounter (Signed)
New patient data received per " mail " from HIM. Given to MD.

## 2016-10-12 ENCOUNTER — Other Ambulatory Visit: Payer: Self-pay | Admitting: *Deleted

## 2016-10-12 DIAGNOSIS — Z9189 Other specified personal risk factors, not elsewhere classified: Secondary | ICD-10-CM

## 2016-10-15 ENCOUNTER — Other Ambulatory Visit (HOSPITAL_BASED_OUTPATIENT_CLINIC_OR_DEPARTMENT_OTHER): Payer: BLUE CROSS/BLUE SHIELD

## 2016-10-15 ENCOUNTER — Ambulatory Visit (HOSPITAL_BASED_OUTPATIENT_CLINIC_OR_DEPARTMENT_OTHER): Payer: BLUE CROSS/BLUE SHIELD | Admitting: Oncology

## 2016-10-15 DIAGNOSIS — Z9189 Other specified personal risk factors, not elsewhere classified: Secondary | ICD-10-CM | POA: Insufficient documentation

## 2016-10-15 DIAGNOSIS — N6091 Unspecified benign mammary dysplasia of right breast: Secondary | ICD-10-CM

## 2016-10-15 LAB — COMPREHENSIVE METABOLIC PANEL
ALBUMIN: 4.4 g/dL (ref 3.5–5.0)
ALT: 33 U/L (ref 0–55)
ANION GAP: 8 meq/L (ref 3–11)
AST: 22 U/L (ref 5–34)
Alkaline Phosphatase: 83 U/L (ref 40–150)
BUN: 15.2 mg/dL (ref 7.0–26.0)
CALCIUM: 9.7 mg/dL (ref 8.4–10.4)
CHLORIDE: 106 meq/L (ref 98–109)
CO2: 27 mEq/L (ref 22–29)
Creatinine: 1 mg/dL (ref 0.6–1.1)
EGFR: 68 mL/min/{1.73_m2} — AB (ref >90)
Glucose: 119 mg/dl (ref 70–140)
POTASSIUM: 3.9 meq/L (ref 3.5–5.1)
Sodium: 141 mEq/L (ref 136–145)
Total Bilirubin: <0.22 mg/dL (ref 0.20–1.20)
Total Protein: 7.7 g/dL (ref 6.4–8.3)

## 2016-10-15 LAB — CBC WITH DIFFERENTIAL/PLATELET
BASO%: 0.4 % (ref 0.0–2.0)
BASOS ABS: 0 10*3/uL (ref 0.0–0.1)
EOS ABS: 0.1 10*3/uL (ref 0.0–0.5)
EOS%: 0.8 % (ref 0.0–7.0)
HEMATOCRIT: 38 % (ref 34.8–46.6)
HEMOGLOBIN: 12.7 g/dL (ref 11.6–15.9)
LYMPH%: 33.5 % (ref 14.0–49.7)
MCH: 30.4 pg (ref 25.1–34.0)
MCHC: 33.4 g/dL (ref 31.5–36.0)
MCV: 90.9 fL (ref 79.5–101.0)
MONO#: 0.6 10*3/uL (ref 0.1–0.9)
MONO%: 7.2 % (ref 0.0–14.0)
NEUT#: 4.5 10*3/uL (ref 1.5–6.5)
NEUT%: 58.1 % (ref 38.4–76.8)
PLATELETS: 301 10*3/uL (ref 145–400)
RBC: 4.18 10*6/uL (ref 3.70–5.45)
RDW: 13.2 % (ref 11.2–14.5)
WBC: 7.8 10*3/uL (ref 3.9–10.3)
lymph#: 2.6 10*3/uL (ref 0.9–3.3)

## 2016-10-15 LAB — DRAW EXTRA CLOT TUBE

## 2016-10-15 NOTE — Progress Notes (Signed)
[facility]  [phone] [phone]     ID: [name] DOB: [birth year]  MR#: [id number]  ZHY#:[id number]  Patient Care Team: Tally Joe, MD as PCP - General (Family Medicine) Huel Cote, MD as Consulting Physician (Obstetrics and Gynecology) Chevis Pretty III, MD as Consulting Physician (General Surgery) Lowella Dell, MD as Consulting Physician (Oncology) Lowella Dell, MD OTHER MD:  CHIEF COMPLAINT: Lobular carcinoma in situ  CURRENT TREATMENT: Consider anti-estrogens for prevention   BREAST CANCER HISTORY: The patient had screening mammography showing questionable calcifications at the 12:00 position of the right breast. She was then referred to the breast Lewis, where right diagnostic mammography 07/25/2016 found the breast density to be category B. At the 12:00 position of the right breast there was a 1.2 cm group of microcalcifications which were pleomorphic.  Biopsy of the right breast area in question 07/27/2016 showed atypical lobular hyperplasia ([id number]).  The patient was then referred to surgery and after appropriate discussion underwent right lumpectomy 08/29/2016. This showed (atypical lobular hyperplasia, with no evidence of carcinoma ([id number]). The specimen radiograph after surgery showed the radioactive seed and the biopsy marker clip.  The patient's subsequent history is as detailed below   INTERVAL HISTORY: [name] was evaluated in the breast clinic 10/15/2016  REVIEW OF SYSTEMS: There were no specific symptoms leading to the original mammogram, which was routinely scheduled. The patient denies unusual headaches, visual changes, nausea, vomiting, stiff neck, dizziness, or gait imbalance. There has been no cough, phlegm production, or pleurisy, no chest pain or pressure, and no change in bowel or bladder habits. The patient denies fever, rash, bleeding, unexplained fatigue or unexplained weight loss. A  detailed review of systems was otherwise entirely negative.   PAST MEDICAL HISTORY: Past Medical History:  Diagnosis Date  . Family history of adverse reaction to anesthesia    mother"hard time coming out of anesthesia"  . History of kidney stones     PAST SURGICAL HISTORY: Past Surgical History:  Procedure Laterality Date  . BREAST LUMPECTOMY WITH RADIOACTIVE SEED LOCALIZATION Right 08/29/2016   Procedure: RADIOACTIVE SEED GUIDED RIGHT BREAST LUMPECTOMY;  Surgeon: Chevis Pretty III, MD;  Location: [facility] OR;  Service: General;  Laterality: Right;  . CHOLECYSTECTOMY  1996  . SHOULDER ARTHROSCOPY W/ SUBACROMIAL DECOMPRESSION AND DISTAL CLAVICLE EXCISION Right 2012  . TUBAL LIGATION  1995    FAMILY HISTORY No family history on file.  The patient's father is living, at age 53. The patient's mother died age 62 from pneumonia. The patient is an only child. There is no history of breast or ovarian cancer in the family to her knowledge  GYNECOLOGIC HISTORY:  No LMP recorded. Patient is not currently having periods (Reason: Perimenopausal). Menarche age 17, first live birth age 79. She is GX P2. She underwent menopause the summer of 2016. She never took hormone replacement. She did take oral contraceptives remotely. 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 Her husband [name] is control her for [employer]. Son [name] is completing a PhD in Environmental manager. Daughter [name] is completing a masters in social work. The patient has no grandchildren.    ADVANCED DIRECTIVES:    HEALTH MAINTENANCE: Social History  Substance Use Topics  . Smoking status: Never Smoker  . Smokeless tobacco: Never Used  . Alcohol use Yes     Comment: rarely  Colonoscopy: Overdue  PAP:  Bone density: Remote   Allergies  Allergen Reactions  . Amoxicillin Anaphylaxis, Shortness Of Breath and  Swelling    Has patient had a PCN reaction causing immediate rash, facial/tongue/throat swelling, SOB or lightheadedness with hypotension: Yes Has patient had a PCN reaction causing severe rash involving mucus membranes or skin necrosis: No Has patient had a PCN reaction that required hospitalization No--was treated in ER Has patient had a PCN reaction occurring within the last 10 years: No If all of the above answers are "NO", then may proceed with Cephalosporin use.   Marland Kitchen Penicillins Anaphylaxis, Shortness Of Breath and Swelling    Has patient had a PCN reaction causing immediate rash, facial/tongue/throat swelling, SOB or lightheadedness with hypotension: Yes Has patient had a PCN reaction causing severe rash involving mucus membranes or skin necrosis: No Has patient had a PCN reaction that required hospitalization No--was treated in ER Has patient had a PCN reaction occurring within the last 10 years: No If all of the above answers are "NO", then may proceed with Cephalosporin use.  Doylene Bode [Sulfamethoxazole-Trimethoprim] Anaphylaxis and Swelling    Facial & throat swelling.  . Codeine Nausea And Vomiting    Non stop vomiting  . Gluten Meal Diarrhea and Swelling    GLUTEN FOOD ALLERGY FACIAL SWELLING GI UPSET(DIARRHEA/STOMACH CRAMPS) ACNE  . Lactose Intolerance (Gi) Diarrhea    STOMACH CRAMPS/DIARRHEA  . Wheat Bran Diarrhea and Swelling    WHEAT FOOD ALLERGY FACIAL SWELLING GI UPSET(DIARRHEA/STOMACH CRAMPS) ACNE   . Latex Rash    Skin irritation/redness/cracked skin/bleeding    Current Outpatient Prescriptions  Medication Sig Dispense Refill  . acetaminophen (TYLENOL) 500 MG tablet Take 500 mg by mouth every 6 (six) hours as needed (for pain.).    . Carboxymethylcell-Hypromellose 0.25-0.3 % GEL Place 1 drop into both eyes at bedtime.    . cholecalciferol (VITAMIN D) 1000 units tablet Take 1,000 Units by mouth daily.    . Multiple Vitamin (MULTIVITAMIN WITH MINERALS) TABS  tablet Take 1 tablet by mouth daily.    . Multiple Vitamins-Minerals (EYE VITAMINS PO) Take 2 capsules by mouth 2 (two) times daily. HydroEye Softgel (proprietary blend of omega fatty acids (including GLA, plus EPA & DHA)    . OVER THE COUNTER MEDICATION Place 1-2 drops into both eyes 5 (five) times daily as needed (for dry eyes.). Oasis Tears Plus  (Ingredients: Glycerin (0.22%), sodium chloride, sodium hyaluronate, sodium phosphate dibasic, sodium phosphate monobasic, water)    . Soft Lens Products (REWETTING DROPS) SOLN Place 1-2 drops into both eyes 5 (five) times daily as needed (for dry eyes (contact lenses)).     No current facility-administered medications for this visit.     OBJECTIVE: [name] who appears well Vitals:   10/15/16 1549  BP: 124/74  Pulse: 84  Resp: 20  Temp: 98 F (36.7 C)     Body mass index is 27.16 kg/m.    ECOG FS:0 - Asymptomatic  Ocular: Sclerae unicteric, pupils equal, round and reactive to light Ear-nose-throat: Oropharynx clear and moist Lymphatic: No cervical or supraclavicular adenopathy Lungs no rales or rhonchi, good excursion bilaterally Heart regular rate and rhythm, no murmur appreciated Abd soft, nontender, positive bowel sounds MSK no focal spinal tenderness, no joint edema Neuro: non-focal, well-oriented, appropriate affect Breasts: The right breast is status post lumpectomy. The cosmetic result is excellent. There is no palpable abnormality of concern and no skin or nipple changes of concern. The left breast  is unremarkable. Both axillae are benign.   LAB RESULTS:  CMP     Component Value Date/Time   NA 141 10/15/2016 1540   K 3.9 10/15/2016 1540   CL 105 08/27/2016 1337   CO2 27 10/15/2016 1540   GLUCOSE 119 10/15/2016 1540   BUN 15.2 10/15/2016 1540   CREATININE 1.0 10/15/2016 1540   CALCIUM 9.7 10/15/2016 1540   PROT 7.7 10/15/2016 1540   ALBUMIN 4.4 10/15/2016 1540   AST 22 10/15/2016 1540   ALT 33  10/15/2016 1540   ALKPHOS 83 10/15/2016 1540   BILITOT <0.22 10/15/2016 1540   GFRNONAA >60 08/27/2016 1337   GFRAA >60 08/27/2016 1337    No results found for: TOTALPROTELP, ALBUMINELP, A1GS, A2GS, BETS, BETA2SER, GAMS, MSPIKE, SPEI  No results found for: Ron Parker, [facility]  Lab Results  Component Value Date   WBC 7.8 10/15/2016   NEUTROABS 4.5 10/15/2016   HGB 12.7 10/15/2016   HCT 38.0 10/15/2016   MCV 90.9 10/15/2016   PLT 301 10/15/2016      Chemistry      Component Value Date/Time   NA 141 10/15/2016 1540   K 3.9 10/15/2016 1540   CL 105 08/27/2016 1337   CO2 27 10/15/2016 1540   BUN 15.2 10/15/2016 1540   CREATININE 1.0 10/15/2016 1540      Component Value Date/Time   CALCIUM 9.7 10/15/2016 1540   ALKPHOS 83 10/15/2016 1540   AST 22 10/15/2016 1540   ALT 33 10/15/2016 1540   BILITOT <0.22 10/15/2016 1540       No results found for: LABCA2  No components found for: [id number]  No results for input(s): INR in the last 168 hours.  Urinalysis    Component Value Date/Time   COLORURINE YELLOW 03/14/2007 1412   APPEARANCEUR CLOUDY (A) 03/14/2007 1412   LABSPEC 1.030 03/14/2007 1412   PHURINE 6.0 03/14/2007 1412   GLUCOSEU NEGATIVE 03/14/2007 1412   HGBUR NEGATIVE 03/14/2007 1412   BILIRUBINUR NEGATIVE 03/14/2007 1412   KETONESUR >80 (A) 03/14/2007 1412   PROTEINUR NEGATIVE 03/14/2007 1412   UROBILINOGEN 0.2 03/14/2007 1412   NITRITE NEGATIVE 03/14/2007 1412   LEUKOCYTESUR  03/14/2007 1412    NEGATIVE MICROSCOPIC NOT DONE ON URINES WITH NEGATIVE PROTEIN, BLOOD, LEUKOCYTES, NITRITE, OR GLUCOSE <1000 mg/dL.     STUDIES: No results found.  ELIGIBLE FOR AVAILABLE RESEARCH PROTOCOL: no  ASSESSMENT: 56 y.o.  [name] Status post right lumpectomy 08/29/2016 for atypical lobular hyperplasia, no carcinoma in specimen  (1) we recommend yearly mammography with tomography with biannual physician breast exam in this patient  (2)  the patient is considering anti-estrogens for prevention  PLAN: We spent the better part of today's 50-minute appointment discussing the biology of breast cancer in general, and the specifics of atypical lobular hyperplasia [ALH] more specifically. [name] understands ALH means, first, relatively unrestricted growth of breast cells and, in addition, some morphologically different features from simple hyperplasia. Atypical lobular hyperplasia can be associated with actual cancer and for that reasons the area of the breast with [name] needs to be removed.  We do not believe that [name] is a precancerous lesion--otherwise it is not clear that an area of [name] well eventually become cancerous. However ALH is a marker of breast cancer risk. The risk of developing breast cancer in patients with ALH falls somewhere between 1-2% per year. The new cancer can develop in either breast. One half of those tumors would be invasive.   Working from the patient's  age to the average survival of women today in the [place] I think [name] can expect to live an additional 30+ years. Using this for purposes of discussion, this would give her a 30-40% chance of developing breast cancer in her lifetime. This is approximately 3 times the normal risk.  One way to deal with this problem is intensified screening. In some patients this means yearly breast MRI in addition to breast mammography with tomography. The problems with this approach are, aside from cost, the false positives that may be noted on breast MRI. This would lead to multiple unnecessary procedures. In this patient, with a breast density category B, I think yearly mammography with tomography will be sensitive and that is my recommendation  As far as actually lowering the breast cancer risk,[name] has tw options. One of them is bilateral mastectomies. This is not recommended for this indication. It does not reduce mortality in this setting. It can be associated with significant  complications and I do not believe it would be covered by insurance even if she wanted it which she doesn't.   The other, more reasonable approach would be to take anti-estrogens. This could be tamoxifen, raloxifene/Evista, or an aromatase inhibitor  We then discussed the possible toxicities, side effects and complications of the tamoxifen group as opposed to the aromatase inhibitors. All information was given to [name] in writing. Any of those agents if taken for 5 years would essentially cut the risk of developing a new breast cancer in half.  We also discussed intensified screening. This would be adding yearly MRI to yearly mammography with tomography. This approach greatly increases sensitivity. Any breast cancer that may develop should be found at the earliest possible stage. However intensified screening has not been documented to improve survival in this population. There are issues regarding cost even if insurance coverage is obtained. There also concerns regarding false positives especially in the first or second year of MRI screening.  Shadawn has a good understanding of all these options. At a minimum she understands she needs to have yearly mammography with tomography and preferably biannual physician breast exams.   As for the prophylactic use of anti-estrogens, she could not decide whether she wanted to pursue them further or not. If she decides to try anti-estrogens she will let me know, I will be glad to prescribe it for her, and I would see her after a few months to make sure she is tolerating it well.   Otherwise I do not feel further follow-up here would be needed so no return appointment has been scheduled. I will be available to [place] in the future if any further questions or concerns regarding breast cancer were to develop.  Lowella Dell, MD   10/15/2016 4:56 PM Medical Oncology and Hematology [facility] [address] [place], [place] [address] Tel.  [phone]    Fax. [phone]

## 2016-11-01 DIAGNOSIS — R946 Abnormal results of thyroid function studies: Secondary | ICD-10-CM | POA: Diagnosis not present

## 2016-11-01 DIAGNOSIS — R7303 Prediabetes: Secondary | ICD-10-CM | POA: Diagnosis not present

## 2016-11-01 DIAGNOSIS — E7800 Pure hypercholesterolemia, unspecified: Secondary | ICD-10-CM | POA: Diagnosis not present

## 2017-03-05 DIAGNOSIS — N6091 Unspecified benign mammary dysplasia of right breast: Secondary | ICD-10-CM | POA: Diagnosis not present

## 2017-05-14 DIAGNOSIS — R7303 Prediabetes: Secondary | ICD-10-CM | POA: Diagnosis not present

## 2017-05-14 DIAGNOSIS — E7800 Pure hypercholesterolemia, unspecified: Secondary | ICD-10-CM | POA: Diagnosis not present

## 2017-05-14 DIAGNOSIS — Z23 Encounter for immunization: Secondary | ICD-10-CM | POA: Diagnosis not present

## 2017-05-14 DIAGNOSIS — F439 Reaction to severe stress, unspecified: Secondary | ICD-10-CM | POA: Diagnosis not present

## 2017-05-20 DIAGNOSIS — H10413 Chronic giant papillary conjunctivitis, bilateral: Secondary | ICD-10-CM | POA: Diagnosis not present

## 2017-05-30 DIAGNOSIS — R7303 Prediabetes: Secondary | ICD-10-CM | POA: Diagnosis not present

## 2017-05-30 DIAGNOSIS — J209 Acute bronchitis, unspecified: Secondary | ICD-10-CM | POA: Diagnosis not present

## 2017-06-03 DIAGNOSIS — H04123 Dry eye syndrome of bilateral lacrimal glands: Secondary | ICD-10-CM | POA: Diagnosis not present

## 2017-06-03 DIAGNOSIS — H43392 Other vitreous opacities, left eye: Secondary | ICD-10-CM | POA: Diagnosis not present

## 2017-06-03 DIAGNOSIS — H35372 Puckering of macula, left eye: Secondary | ICD-10-CM | POA: Diagnosis not present

## 2017-06-03 DIAGNOSIS — H10413 Chronic giant papillary conjunctivitis, bilateral: Secondary | ICD-10-CM | POA: Diagnosis not present

## 2017-06-03 DIAGNOSIS — H2513 Age-related nuclear cataract, bilateral: Secondary | ICD-10-CM | POA: Diagnosis not present

## 2017-07-05 DIAGNOSIS — K5730 Diverticulosis of large intestine without perforation or abscess without bleeding: Secondary | ICD-10-CM | POA: Diagnosis not present

## 2017-07-05 DIAGNOSIS — K633 Ulcer of intestine: Secondary | ICD-10-CM | POA: Diagnosis not present

## 2017-07-05 DIAGNOSIS — Z1211 Encounter for screening for malignant neoplasm of colon: Secondary | ICD-10-CM | POA: Diagnosis not present

## 2017-07-05 DIAGNOSIS — K640 First degree hemorrhoids: Secondary | ICD-10-CM | POA: Diagnosis not present

## 2017-07-12 DIAGNOSIS — K633 Ulcer of intestine: Secondary | ICD-10-CM | POA: Diagnosis not present

## 2017-07-22 DIAGNOSIS — Z1283 Encounter for screening for malignant neoplasm of skin: Secondary | ICD-10-CM | POA: Diagnosis not present

## 2017-07-22 DIAGNOSIS — L821 Other seborrheic keratosis: Secondary | ICD-10-CM | POA: Diagnosis not present

## 2017-08-05 DIAGNOSIS — Z01419 Encounter for gynecological examination (general) (routine) without abnormal findings: Secondary | ICD-10-CM | POA: Diagnosis not present

## 2017-08-05 DIAGNOSIS — Z0000 Encounter for general adult medical examination without abnormal findings: Secondary | ICD-10-CM | POA: Diagnosis not present

## 2017-08-05 DIAGNOSIS — Z124 Encounter for screening for malignant neoplasm of cervix: Secondary | ICD-10-CM | POA: Diagnosis not present

## 2017-08-05 DIAGNOSIS — Z1389 Encounter for screening for other disorder: Secondary | ICD-10-CM | POA: Diagnosis not present

## 2017-08-05 DIAGNOSIS — Z1151 Encounter for screening for human papillomavirus (HPV): Secondary | ICD-10-CM | POA: Diagnosis not present

## 2017-08-05 DIAGNOSIS — Z1231 Encounter for screening mammogram for malignant neoplasm of breast: Secondary | ICD-10-CM | POA: Diagnosis not present

## 2017-08-05 DIAGNOSIS — Z6826 Body mass index (BMI) 26.0-26.9, adult: Secondary | ICD-10-CM | POA: Diagnosis not present

## 2017-08-08 DIAGNOSIS — R7303 Prediabetes: Secondary | ICD-10-CM | POA: Diagnosis not present

## 2017-08-08 DIAGNOSIS — J209 Acute bronchitis, unspecified: Secondary | ICD-10-CM | POA: Diagnosis not present

## 2017-10-11 IMAGING — MG STEREOTACTIC VACUUM ASSIST RIGHT
8 of 14 series · 8 of 18 positions shown · non-contrast
Comparison: Previous exams.

ADDENDUM:
Pathology revealed ATYPICAL LOBULAR HYPERPLASIA (ALH), FIBROCYSTIC
CHANGES WITH CALCIFICATIONS, PSEUDOANGIOMATOUS STROMAL HYPERPLASIA
(PASH) of the Right breast, 12:00 o'clock. This was found to be
concordant by Dr. Elmar Opara, with excision recommended.
Pathology results were discussed with the patient by telephone. The
patient reported doing well after the biopsy with tenderness and
bruising at the site. Post biopsy instructions and care were
reviewed and questions were answered. The patient was encouraged to
call The [REDACTED] for any additional
concerns. Surgical consultation has been arranged with Dr. Emelia Yaa Pieterson
at [REDACTED] on August 10, 2016.

Pathology results reported by Atilio Stenson, RN on 07/30/2016.
CLINICAL DATA: Right breast 12 o'clock calcifications.
EXAM:
RIGHT BREAST STEREOTACTIC CORE NEEDLE BIOPSY

[R (1 of 8)]
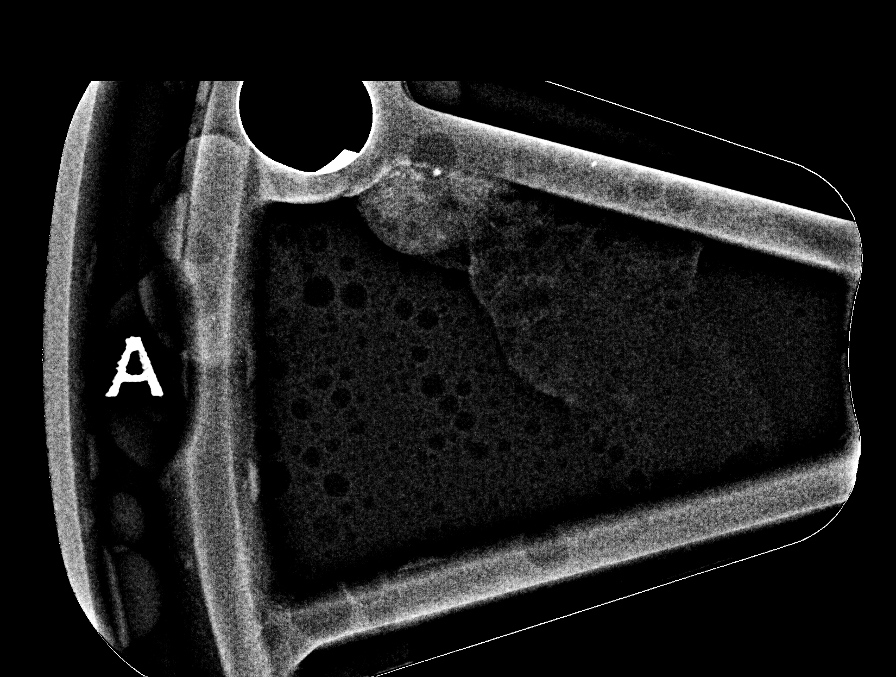

[R (2 of 8)]
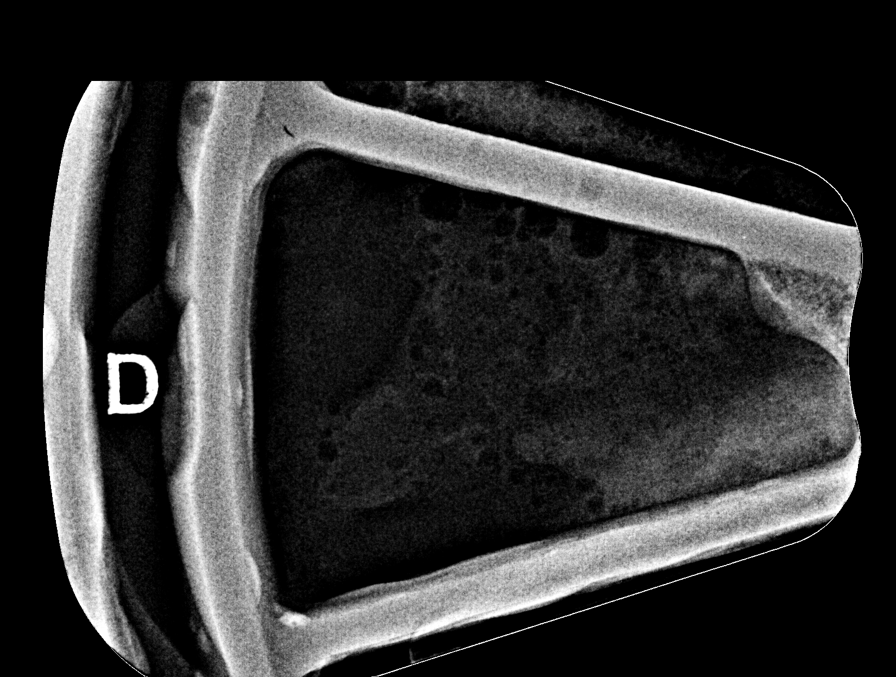

[R (3 of 8)]
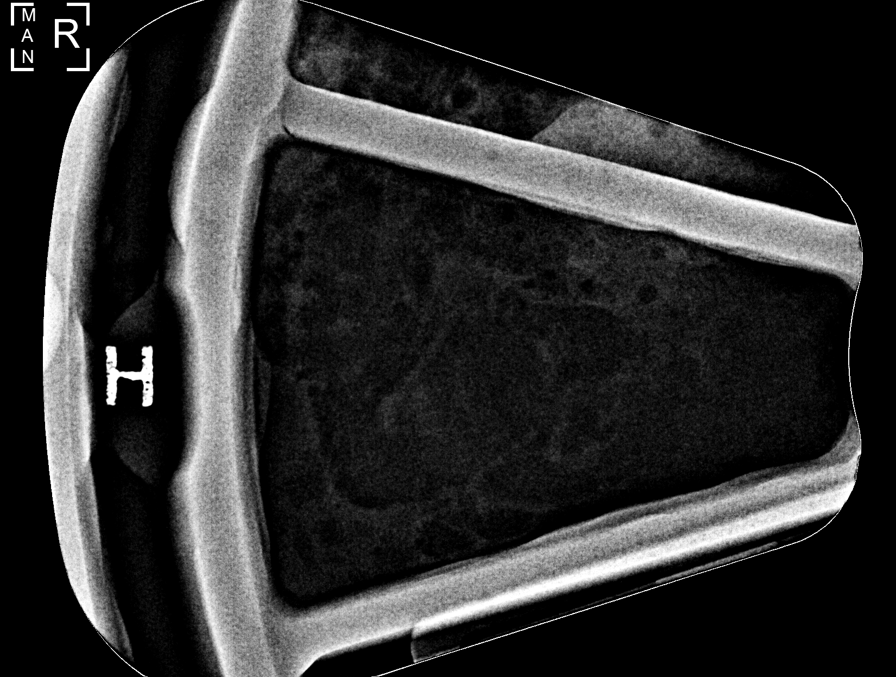

[R (4 of 8)]
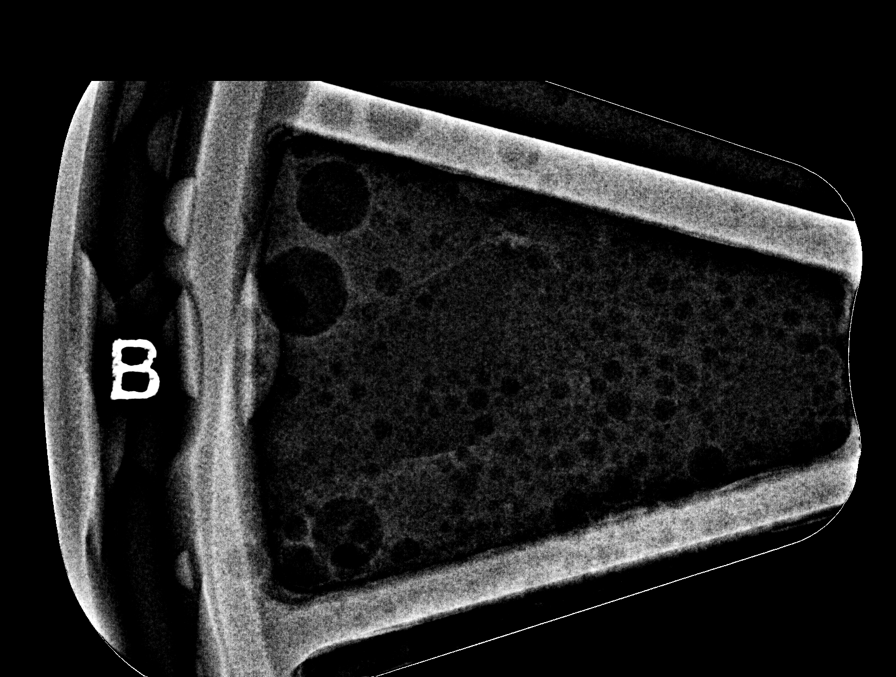

[R (5 of 8)]
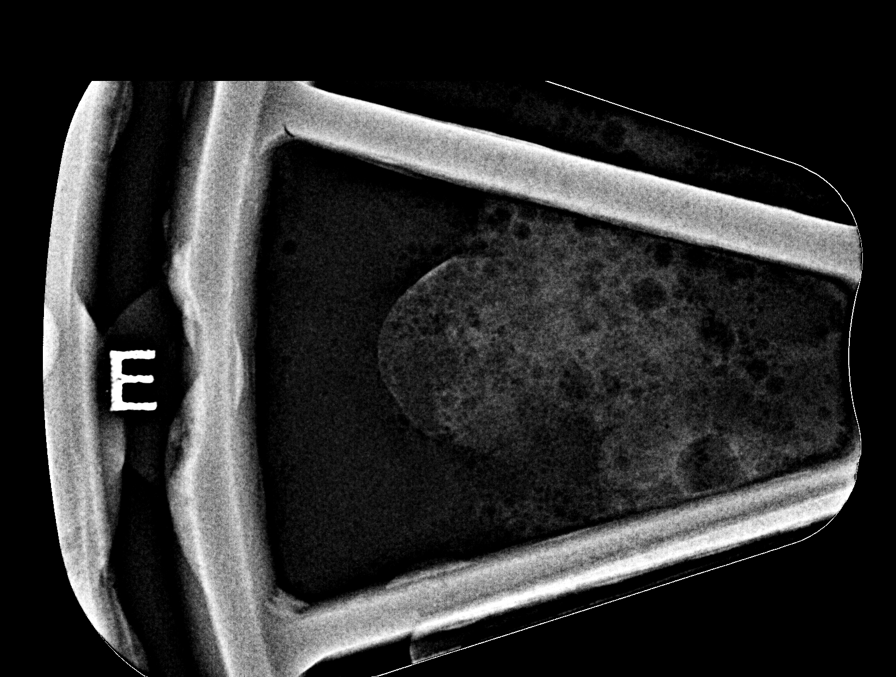

[R (6 of 8)]
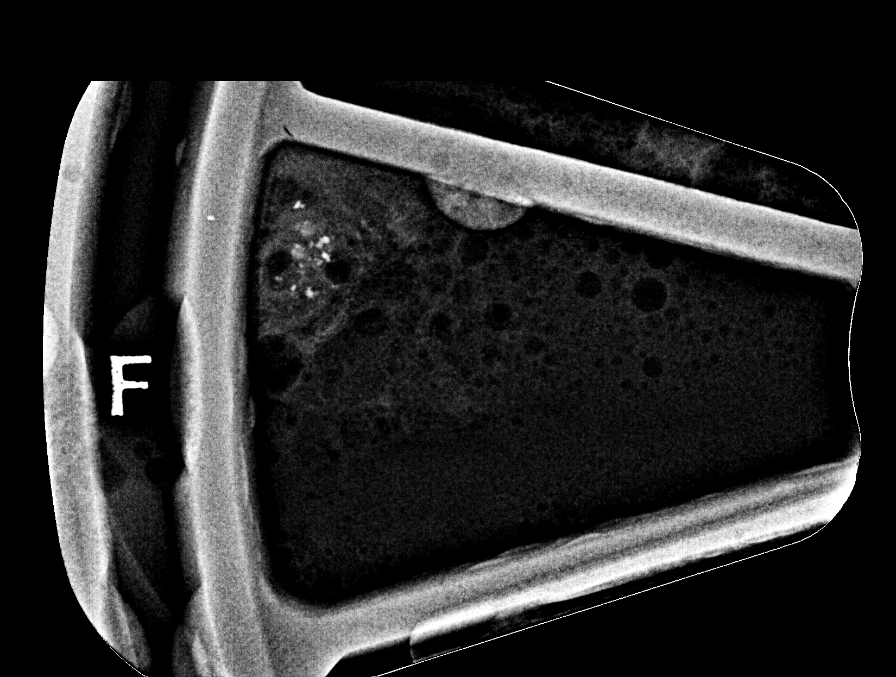

[R (7 of 8)]
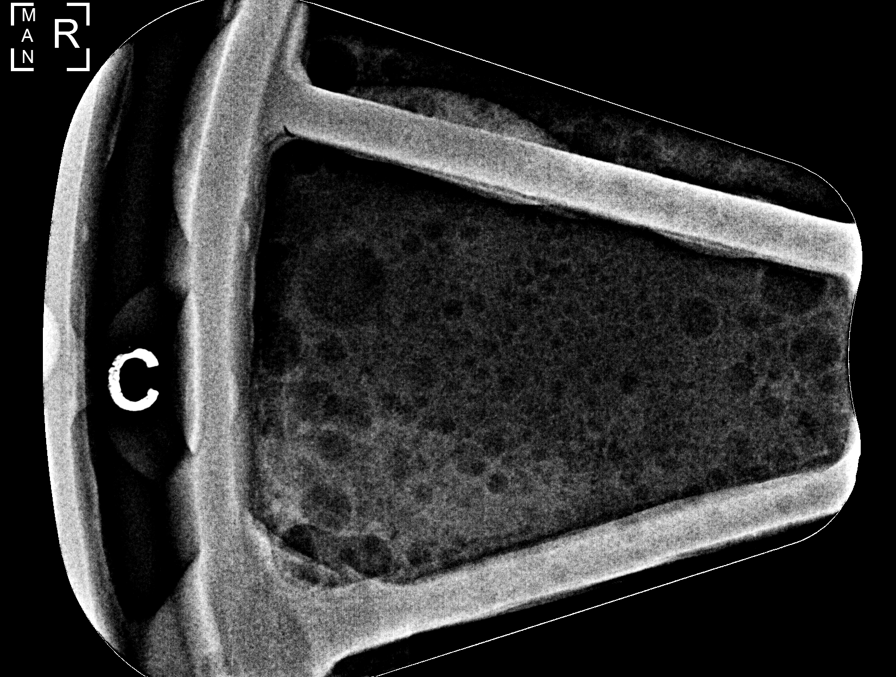

[R (8 of 8)]
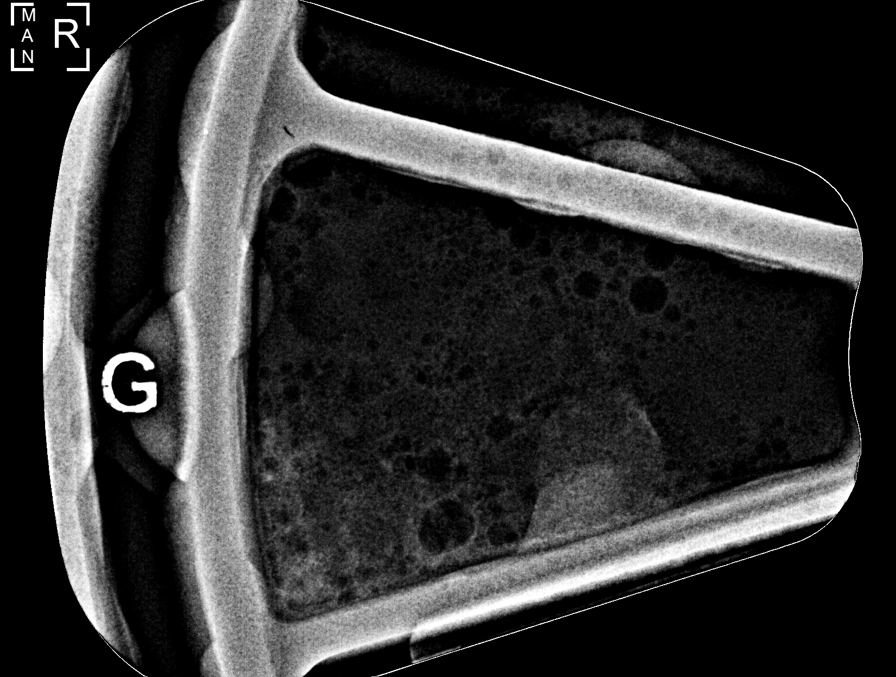

[8 of 18 positions shown; findings below may reference images not displayed]



Using sterile technique and 1% Lidocaine as local anesthetic, under
stereotactic guidance, a 9 gauge vacuum assisted device was used to
perform core needle biopsy of calcifications in the right 12 o'clock
breast using a superior approach. Specimen radiograph was performed
showing calcifications within the specimen. Specimens with
calcifications are identified for pathology.

At the conclusion of the procedure, a coil shaped tissue marker clip
was deployed into the biopsy cavity. Follow-up 2-view mammogram was
performed and dictated separately.
IMPRESSION: Stereotactic-guided biopsy of right breast. No apparent
complications.

## 2017-10-28 DIAGNOSIS — N6091 Unspecified benign mammary dysplasia of right breast: Secondary | ICD-10-CM | POA: Diagnosis not present

## 2017-11-05 DIAGNOSIS — R69 Illness, unspecified: Secondary | ICD-10-CM | POA: Diagnosis not present

## 2017-11-05 DIAGNOSIS — R509 Fever, unspecified: Secondary | ICD-10-CM | POA: Diagnosis not present

## 2017-12-22 ENCOUNTER — Encounter (HOSPITAL_COMMUNITY): Payer: Self-pay | Admitting: Emergency Medicine

## 2017-12-22 ENCOUNTER — Emergency Department (HOSPITAL_COMMUNITY)
Admission: EM | Admit: 2017-12-22 | Discharge: 2017-12-22 | Disposition: A | Discharge status: Home / Self Care | Payer: BLUE CROSS/BLUE SHIELD | Attending: Emergency Medicine | Admitting: Emergency Medicine

## 2017-12-22 DIAGNOSIS — M25561 Pain in right knee: Secondary | ICD-10-CM | POA: Diagnosis not present

## 2017-12-22 DIAGNOSIS — R51 Headache: Secondary | ICD-10-CM | POA: Diagnosis not present

## 2017-12-22 DIAGNOSIS — Y9301 Activity, walking, marching and hiking: Secondary | ICD-10-CM | POA: Diagnosis not present

## 2017-12-22 DIAGNOSIS — M25562 Pain in left knee: Secondary | ICD-10-CM | POA: Diagnosis not present

## 2017-12-22 DIAGNOSIS — Z79899 Other long term (current) drug therapy: Secondary | ICD-10-CM | POA: Insufficient documentation

## 2017-12-22 DIAGNOSIS — W19XXXA Unspecified fall, initial encounter: Secondary | ICD-10-CM

## 2017-12-22 DIAGNOSIS — Z91040 Latex allergy status: Secondary | ICD-10-CM | POA: Insufficient documentation

## 2017-12-22 DIAGNOSIS — M25511 Pain in right shoulder: Secondary | ICD-10-CM | POA: Diagnosis not present

## 2017-12-22 DIAGNOSIS — M542 Cervicalgia: Secondary | ICD-10-CM | POA: Diagnosis not present

## 2017-12-22 DIAGNOSIS — S4991XA Unspecified injury of right shoulder and upper arm, initial encounter: Secondary | ICD-10-CM | POA: Diagnosis not present

## 2017-12-22 DIAGNOSIS — W108XXA Fall (on) (from) other stairs and steps, initial encounter: Secondary | ICD-10-CM | POA: Diagnosis not present

## 2017-12-22 DIAGNOSIS — M545 Low back pain: Secondary | ICD-10-CM | POA: Insufficient documentation

## 2017-12-22 DIAGNOSIS — Y998 Other external cause status: Secondary | ICD-10-CM | POA: Diagnosis not present

## 2017-12-22 DIAGNOSIS — Y9222 Religious institution as the place of occurrence of the external cause: Secondary | ICD-10-CM | POA: Insufficient documentation

## 2017-12-22 DIAGNOSIS — T1490XA Injury, unspecified, initial encounter: Secondary | ICD-10-CM | POA: Insufficient documentation

## 2017-12-22 MED ORDER — METHOCARBAMOL 500 MG PO TABS: 500.0000 mg | ORAL_TABLET | Freq: Every evening | ORAL | 0 refills | Status: AC | PRN

## 2017-12-22 NOTE — Discharge Instructions (Signed)
Take ibuprofen 3 times a day with meals (up to 4 pills (800 mg) at a time).  Do not take other anti-inflammatories at the same time open (Advil, Motrin, ibuprofen, Aleve). You may supplement with Tylenol if you need further pain control. Use Robaxin as needed for muscle stiffness or soreness. Have caution, as this may make you tired or groggy. Do not drive or operate heavy machinery while taking this medication.  Use muscle creams (bengay, icy hot, salonpas) as needed for pain.  Follow up with your primary care doctor if pain is not improving with this treatment in 1 week.  Return to the ER if you develop severe headaches, vision changes, slurred speech, difficulty walking, numbness, loss of bowel or bladder control, or any new or concerning symptoms.

## 2017-12-22 NOTE — ED Provider Notes (Signed)
[facility]-EMERGENCY DEPT Provider Note   CSN: [id number] Arrival date & time: 12/22/17  1039     History   Chief Complaint Chief Complaint  Patient presents with  . Fall  . Generalized Body Aches    HPI [name] is a 57 y.o. female sending for evaluation of right sided shoulder/neck pain, bilateral knee pain, and low back pain after a fall.  Patient states she was going down the stairs at church in her rain boots when she suddenly slipped and fell down the stairs.  She denies hitting her head or loss of consciousness.  She denies having chest pain, shortness of breath, or syncopized and prior to the fall.  She is not on blood thinners.  She reports a mild left-sided headache, right-sided neck and shoulder pain, low back pain, bilateral knee pain.  She has ambulated since without difficulty, although with increased pain.  She denies numbness or tingling.  She has not taken anything for pain including Tylenol or ibuprofen.  Movement and palpation makes the pain worse, nothing makes it better.  She denies vision changes, slurred speech, chest pain, shortness of breath, nausea, vomiting, abdominal pain, loss of bowel or bladder control.  She has no medical problems, takes no medications daily.  HPI  Past Medical History:  Diagnosis Date  . Family history of adverse reaction to anesthesia    mother"hard time coming out of anesthesia"  . History of kidney stones     Patient Active Problem List   Diagnosis Date Noted  . Atypical lobular hyperplasia [facility] of right breast 10/15/2016  . At high risk for breast cancer 10/15/2016    Past Surgical History:  Procedure Laterality Date  . BREAST LUMPECTOMY WITH RADIOACTIVE SEED LOCALIZATION Right 08/29/2016   Procedure: RADIOACTIVE SEED GUIDED RIGHT BREAST LUMPECTOMY;  Surgeon: Chevis PrettyPaul Toth III, MD;  Location: [facility] OR;  Service: General;  Laterality: Right;  . CHOLECYSTECTOMY  1996  . SHOULDER ARTHROSCOPY W/  SUBACROMIAL DECOMPRESSION AND DISTAL CLAVICLE EXCISION Right 2012  . TUBAL LIGATION  1995     OB History   None      Home Medications    Prior to Admission medications   Medication Sig Start Date End Date Taking? Authorizing Provider  acetaminophen (TYLENOL) 500 MG tablet Take 500 mg by mouth every 6 (six) hours as needed (for pain.).   Yes [provider]  CALCIUM PO Take by mouth.   Yes [provider]  Carboxymethylcell-Hypromellose 0.25-0.3 % GEL Place 1 drop into both eyes at bedtime.   Yes [provider]  cholecalciferol (VITAMIN D) 1000 units tablet Take 1,000 Units by mouth daily.   Yes [provider]  OVER THE COUNTER MEDICATION Place 1-2 drops into both eyes 5 (five) times daily as needed (for dry eyes.). Oasis Tears Plus  (Ingredients: Glycerin (0.22%), sodium chloride, sodium hyaluronate, sodium phosphate dibasic, sodium phosphate monobasic, water)   Yes [provider]  Soft Lens Products (REWETTING DROPS) SOLN Place 1-2 drops into both eyes 5 (five) times daily as needed (for dry eyes (contact lenses)).   Yes [provider]  methocarbamol (ROBAXIN) 500 MG tablet Take 1 tablet (500 mg total) by mouth at bedtime as needed for muscle spasms. 12/22/17   Levonte Molina, PA-C    Family History No family history on file.  Social History Social History   Tobacco Use  . Smoking status: Never Smoker  . Smokeless tobacco: Never Used  Substance Use Topics  .  Alcohol use: Yes    Comment: rarely  . Drug use: No     Allergies   Amoxicillin; Penicillins; Septra [sulfamethoxazole-trimethoprim]; Codeine; Gluten meal; Lactose intolerance (gi); Wheat bran; and Latex   Review of Systems Review of Systems  Musculoskeletal: Positive for arthralgias, back pain and neck pain.  Skin: Positive for wound.  Neurological: Positive for headaches. Negative for dizziness, weakness and light-headedness.  Hematological: Does not  bruise/bleed easily.  All other systems reviewed and are negative.    Physical Exam Updated Vital Signs BP 132/72 (BP Location: Left Arm)   Pulse 61   Temp 97.8 F (36.6 C) (Oral)   Resp 17   Ht 5\' 2"  (1.575 m)   SpO2 98%   BMI 27.16 kg/m   Physical Exam  Constitutional: She is oriented to person, place, and time. She appears well-developed and well-nourished. No distress.  Appears in no distress  HENT:  Head: Normocephalic and atraumatic.  Right Ear: Tympanic membrane, external ear and ear canal normal.  Left Ear: Tympanic membrane, external ear and ear canal normal.  Nose: Nose normal.  Mouth/Throat: Uvula is midline, oropharynx is clear and moist and mucous membranes are normal.  No malocclusion. No TTP of head or scalp. No obvious laceration, hematoma or injury.   Eyes: Pupils are equal, round, and reactive to light. EOM are normal.  Neck: Normal range of motion. Neck supple.  No TTP of midline c-spine. No step offs.  Tenderness to palpation of right-sided neck musculature.  Cardiovascular: Normal rate, regular rhythm and intact distal pulses.  Pulmonary/Chest: Effort normal and breath sounds normal. She exhibits no tenderness.  No TTP of the chest wall  Abdominal: Soft. She exhibits no distension. There is no tenderness.  No TTP of the abd  Musculoskeletal: She exhibits tenderness.  Tenderness to palpation of right trapezius without pain over bony aspects.  No tenderness to palpation of midline spine.  Mild tenderness to palpation of right low back musculature.  Superficial abrasion of right anterior knee without pain over joint line.  No tenderness to palpation of left knee.  Contusion of left shin without wound.  Soft compartments.  Radial and pedal pulses intact bilaterally.  Strength intact x4.  Sensation intact x4.  Patient is ambulatory.  Neurological: She is alert and oriented to person, place, and time. She has normal strength. No cranial nerve deficit or sensory  deficit. GCS eye subscore is 4. GCS verbal subscore is 5. GCS motor subscore is 6.  Fine movement and coordination intact  Skin: Skin is warm. Capillary refill takes less than 2 seconds.  Psychiatric: She has a normal mood and affect.  Nursing note and vitals reviewed.    ED Treatments / Results  Labs (all labs ordered are listed, but only abnormal results are displayed) Labs Reviewed - No data to display  EKG None  Radiology No results found.  Procedures Procedures (including critical care time)  Medications Ordered in ED Medications - No data to display   Initial Impression / Assessment and Plan / ED Course  I have reviewed the triage vital signs and the nursing notes.  Pertinent labs & imaging results that were available during my care of the patient were reviewed by me and considered in my medical decision making (see chart for details).     Patient presenting for evaluation after a fall.  Per history, fall is mechanical.  Physical exam reassuring, she is neurovascularly intact.  Pain is reproducible with palpation of the musculature.  No pain over bony processes.  Discussed with patient, including normal neuro exam.  Discussed option of CT if she is concerned about her headache, and patient states it is mild and she is not concerned at this time.  Discussed use of NSAIDs and muscle relaxers as needed.  Muscle creams and heat/ice discussed.  Discussed normal course of muscle stiffness/soreness, and follow-up with PCP in 1 week if symptoms are not improving.  At this time, patient appears safe for discharge.  Return precautions given.  Patient states she understands and agrees to plan.  Final Clinical Impressions(s) / ED Diagnoses   Final diagnoses:  Fall, initial encounter  Muscle injury    ED Discharge Orders        Ordered    methocarbamol (ROBAXIN) 500 MG tablet  At bedtime PRN     12/22/17 1154       Zury Fazzino, PA-C 12/22/17 1512    Azalia Bilis,  MD 12/22/17 2303

## 2017-12-22 NOTE — ED Triage Notes (Signed)
Pt reports that she fell down some concrete stairs around 9am. unforseen as to what caused her to fall. Reports rolling down stairs and denies hitting head, LOC or taking blood thinners. Pains in neck back, right upper arm, bilat lower legs.

## 2018-02-25 DIAGNOSIS — F4321 Adjustment disorder with depressed mood: Secondary | ICD-10-CM | POA: Diagnosis not present

## 2018-02-25 DIAGNOSIS — R7303 Prediabetes: Secondary | ICD-10-CM | POA: Diagnosis not present

## 2018-02-25 DIAGNOSIS — R946 Abnormal results of thyroid function studies: Secondary | ICD-10-CM | POA: Diagnosis not present

## 2018-02-25 DIAGNOSIS — E7800 Pure hypercholesterolemia, unspecified: Secondary | ICD-10-CM | POA: Diagnosis not present

## 2018-04-23 DIAGNOSIS — N6091 Unspecified benign mammary dysplasia of right breast: Secondary | ICD-10-CM | POA: Diagnosis not present

## 2018-07-30 DIAGNOSIS — Z1283 Encounter for screening for malignant neoplasm of skin: Secondary | ICD-10-CM | POA: Diagnosis not present

## 2018-07-30 DIAGNOSIS — D225 Melanocytic nevi of trunk: Secondary | ICD-10-CM | POA: Diagnosis not present

## 2018-08-26 DIAGNOSIS — Z0000 Encounter for general adult medical examination without abnormal findings: Secondary | ICD-10-CM | POA: Diagnosis not present

## 2018-08-26 DIAGNOSIS — Z1389 Encounter for screening for other disorder: Secondary | ICD-10-CM | POA: Diagnosis not present

## 2018-08-26 DIAGNOSIS — Z1231 Encounter for screening mammogram for malignant neoplasm of breast: Secondary | ICD-10-CM | POA: Diagnosis not present

## 2018-08-26 DIAGNOSIS — Z01419 Encounter for gynecological examination (general) (routine) without abnormal findings: Secondary | ICD-10-CM | POA: Diagnosis not present

## 2018-08-26 DIAGNOSIS — Z6826 Body mass index (BMI) 26.0-26.9, adult: Secondary | ICD-10-CM | POA: Diagnosis not present

## 2018-08-29 ENCOUNTER — Other Ambulatory Visit: Payer: Self-pay | Admitting: Obstetrics and Gynecology

## 2018-08-29 DIAGNOSIS — N62 Hypertrophy of breast: Secondary | ICD-10-CM

## 2018-09-11 ENCOUNTER — Other Ambulatory Visit: Payer: Self-pay | Admitting: Obstetrics and Gynecology

## 2018-09-12 ENCOUNTER — Other Ambulatory Visit: Payer: BLUE CROSS/BLUE SHIELD

## 2018-10-03 ENCOUNTER — Other Ambulatory Visit: Payer: BLUE CROSS/BLUE SHIELD

## 2018-11-06 DIAGNOSIS — N6091 Unspecified benign mammary dysplasia of right breast: Secondary | ICD-10-CM | POA: Diagnosis not present

## 2018-11-19 ENCOUNTER — Other Ambulatory Visit: Payer: Self-pay | Admitting: General Surgery

## 2018-11-21 ENCOUNTER — Other Ambulatory Visit: Payer: Self-pay | Admitting: General Surgery

## 2018-11-21 DIAGNOSIS — Z1239 Encounter for other screening for malignant neoplasm of breast: Secondary | ICD-10-CM

## 2018-12-03 ENCOUNTER — Ambulatory Visit
Admission: RE | Admit: 2018-12-03 | Discharge: 2018-12-03 | Disposition: A | Discharge status: Home / Self Care | Payer: BLUE CROSS/BLUE SHIELD | Source: Ambulatory Visit | Attending: General Surgery | Admitting: General Surgery

## 2018-12-03 ENCOUNTER — Other Ambulatory Visit: Payer: Self-pay

## 2018-12-03 DIAGNOSIS — Z1239 Encounter for other screening for malignant neoplasm of breast: Secondary | ICD-10-CM

## 2018-12-03 MED ORDER — GADOBUTROL 1 MMOL/ML IV SOLN
7.0000 mL | Freq: Once | INTRAVENOUS | Status: AC | PRN
  Administered 2018-12-03: 7 mL via INTRAVENOUS

## 2018-12-26 DIAGNOSIS — R300 Dysuria: Secondary | ICD-10-CM | POA: Diagnosis not present

## 2019-04-14 DIAGNOSIS — Z23 Encounter for immunization: Secondary | ICD-10-CM | POA: Diagnosis not present

## 2019-04-14 DIAGNOSIS — H6123 Impacted cerumen, bilateral: Secondary | ICD-10-CM | POA: Diagnosis not present

## 2019-05-07 DIAGNOSIS — N6091 Unspecified benign mammary dysplasia of right breast: Secondary | ICD-10-CM | POA: Diagnosis not present

## 2019-06-15 DIAGNOSIS — R519 Headache, unspecified: Secondary | ICD-10-CM | POA: Diagnosis not present

## 2019-06-15 DIAGNOSIS — S161XXA Strain of muscle, fascia and tendon at neck level, initial encounter: Secondary | ICD-10-CM | POA: Diagnosis not present

## 2019-08-05 DIAGNOSIS — Z1283 Encounter for screening for malignant neoplasm of skin: Secondary | ICD-10-CM | POA: Diagnosis not present

## 2019-08-05 DIAGNOSIS — D225 Melanocytic nevi of trunk: Secondary | ICD-10-CM | POA: Diagnosis not present

## 2019-09-11 DIAGNOSIS — Z1389 Encounter for screening for other disorder: Secondary | ICD-10-CM | POA: Diagnosis not present

## 2019-09-11 DIAGNOSIS — Z6827 Body mass index (BMI) 27.0-27.9, adult: Secondary | ICD-10-CM | POA: Diagnosis not present

## 2019-09-11 DIAGNOSIS — Z1231 Encounter for screening mammogram for malignant neoplasm of breast: Secondary | ICD-10-CM | POA: Diagnosis not present

## 2019-09-11 DIAGNOSIS — Z01419 Encounter for gynecological examination (general) (routine) without abnormal findings: Secondary | ICD-10-CM | POA: Diagnosis not present

## 2019-09-11 DIAGNOSIS — Z0000 Encounter for general adult medical examination without abnormal findings: Secondary | ICD-10-CM | POA: Diagnosis not present

## 2019-10-22 ENCOUNTER — Other Ambulatory Visit: Payer: Self-pay | Admitting: General Surgery

## 2019-10-22 DIAGNOSIS — Z1239 Encounter for other screening for malignant neoplasm of breast: Secondary | ICD-10-CM

## 2020-04-07 DIAGNOSIS — Z91018 Allergy to other foods: Secondary | ICD-10-CM | POA: Diagnosis not present

## 2020-04-07 DIAGNOSIS — Z887 Allergy status to serum and vaccine status: Secondary | ICD-10-CM | POA: Diagnosis not present

## 2020-04-07 DIAGNOSIS — Z7189 Other specified counseling: Secondary | ICD-10-CM | POA: Diagnosis not present

## 2020-04-08 ENCOUNTER — Telehealth: Payer: Self-pay | Admitting: Allergy & Immunology

## 2020-04-08 NOTE — Telephone Encounter (Signed)
Please advise 

## 2020-04-08 NOTE — Telephone Encounter (Signed)
Referral from [facility], Dr. Azucena Cecil, sent over for allergy to vaccine. Patient would like to discuss proceeding with COVID vaccine. Per referral note, "her last flu shot she had a severe side effect.Marland KitchenMarland KitchenMarland KitchenShe is intolerant to latex, codeine, Septra, and amoxicillin."  Please advise.

## 2020-04-11 NOTE — Telephone Encounter (Signed)
What kind of reaction did she have to the flu shot?   Please ask patient these questions. She may need component testing.   Any known reactions to polyethylene glycol or polysorbate?   Any history of anaphylaxis to vaccinations, including a COVID19 vaccine? (anaphylaxis ? 2 of the following: flushing, itching, hives, swelling, rhinorrhea, wheezing, abdominal pain, N/V, dizziness,  tachycardia, anxiety, sensation of doom, or any one single serious symptom.   Any history of reactions to injectable medications?  Any history of anaphylaxis to colonoscopy preps (i.e.Miralax)?  Any history of dermal filler treatments in the last year?

## 2020-04-11 NOTE — Telephone Encounter (Signed)
Can you please advise as Dr. Dellis Anes is out this week? Thanks!

## 2020-04-11 NOTE — Telephone Encounter (Signed)
Left message for patient to call back  

## 2020-04-12 NOTE — Telephone Encounter (Signed)
Left voicemail to schedule COVID component testing. [facility] on 10/11 is vaccine only, so will have to be scheduled on another day.

## 2020-04-12 NOTE — Telephone Encounter (Signed)
Can someone schedule this appointment? Thank you!

## 2020-04-12 NOTE — Telephone Encounter (Signed)
Patient called back and states she has a reaction to the flu vaccine. She had nausea, vomiting, pain and a high fever and it lasted several day. Patient is allergic to amoxicillin, penicillin, septra, codeine and latex. No known reactions to polyethylene glycol or polysorbate. Patient does not recall any history of anaphylaxis to any other vaccinations other than the flu vaccine. She does not have a history of reactions to injectable medications. Patient states she has had a reaction to colonoscopy prep. She states she could not finish the prep due to having severe abdominal pain, abdominal swelling and diarrhea. No history of dermal filler treatments.

## 2020-04-12 NOTE — Telephone Encounter (Signed)
Please have patient schedule for new patient visit with component testing.  Schedule on 10/11 at 8:30AM in [facility] with Dr. Dellis Anes - he is doing vaccine clinic that day.  Some of her symptoms do not seem to be an immediate allergic reaction and more so related to just side effects.   Allergic reactions to oral medications is not a contraindication for the vaccine.

## 2020-05-04 NOTE — Progress Notes (Deleted)
New Patient Note  RE: [name] MRN: [id number] DOB: 12/23/1960 Date of Office Visit: 05/05/2020  Referring provider: Tally Joe, MD Primary care provider: Tally Joe, MD  Chief Complaint: No chief complaint on file.  History of Present Illness: I had the pleasure of seeing [name] for initial evaluation at the [facility] on 05/04/2020. She is a 59 y.o. female, who is referred here by Tally Joe, MD for the evaluation of ***.  Any known reactions to polyethylene glycol or polysorbate?   Any history of anaphylaxis to vaccinations, including a COVID19 vaccine? (anaphylaxis ? 2 of the following: flushing, itching, hives, swelling, rhinorrhea, wheezing, abdominal pain, N/V, dizziness,  tachycardia, anxiety, sensation of doom, or any one single serious symptom -see page 2 for more details)  Any history of reactions to injectable medications?  Any history of anaphylaxis to colonoscopy preps (i.e.Miralax)?  Any history of dermal filler treatments in the last year?  Assessment and Plan: [name] is a 59 y.o. female with: No problem-specific Assessment & Plan notes found for this encounter.  No follow-ups on file.  No orders of the defined types were placed in this encounter.  Lab Orders  No laboratory test(s) ordered today    Other allergy screening: Asthma: {Blank single:19197::"yes","no"} Rhino conjunctivitis: {Blank single:19197::"yes","no"} Food allergy: {Blank single:19197::"yes","no"} Medication allergy: {Blank single:19197::"yes","no"} Hymenoptera allergy: {Blank single:19197::"yes","no"} Urticaria: {Blank single:19197::"yes","no"} Eczema:{Blank single:19197::"yes","no"} History of recurrent infections suggestive of immunodeficency: {Blank single:19197::"yes","no"}  Diagnostics: Spirometry:  Tracings reviewed. Her effort: {Blank single:19197::"Good reproducible efforts.","It was hard to get consistent efforts and there is a  question as to whether this reflects a maximal maneuver.","Poor effort, data can not be interpreted."} FVC: ***L FEV1: ***L, ***% predicted FEV1/FVC ratio: ***% Interpretation: {Blank single:19197::"Spirometry consistent with mild obstructive disease","Spirometry consistent with moderate obstructive disease","Spirometry consistent with severe obstructive disease","Spirometry consistent with possible restrictive disease","Spirometry consistent with mixed obstructive and restrictive disease","Spirometry uninterpretable due to technique","Spirometry consistent with normal pattern","No overt abnormalities noted given today's efforts"}.  Please see scanned spirometry results for details.  Skin Testing: {Blank single:19197::"Select foods","Environmental allergy panel","Environmental allergy panel and select foods","Food allergy panel","None","Deferred due to recent antihistamines use"}. Positive test to: ***. Negative test to: ***.  Results discussed with patient/family.   Past Medical History: Patient Active Problem List   Diagnosis Date Noted  . Atypical lobular hyperplasia [facility]) of right breast 10/15/2016  . At high risk for breast cancer 10/15/2016   Past Medical History:  Diagnosis Date  . Family history of adverse reaction to anesthesia    mother"hard time coming out of anesthesia"  . History of kidney stones    Past Surgical History: Past Surgical History:  Procedure Laterality Date  . BREAST LUMPECTOMY WITH RADIOACTIVE SEED LOCALIZATION Right 08/29/2016   Procedure: RADIOACTIVE SEED GUIDED RIGHT BREAST LUMPECTOMY;  Surgeon: Chevis Pretty III, MD;  Location: [facility] OR;  Service: General;  Laterality: Right;  . CHOLECYSTECTOMY  1996  . SHOULDER ARTHROSCOPY W/ SUBACROMIAL DECOMPRESSION AND DISTAL CLAVICLE EXCISION Right 2012  . TUBAL LIGATION  1995   Medication List:  Current Outpatient Medications  Medication Sig Dispense Refill  . acetaminophen (TYLENOL) 500 MG tablet Take 500 mg by mouth  every 6 (six) hours as needed (for pain.).    Marland Kitchen CALCIUM PO Take by mouth.    . Carboxymethylcell-Hypromellose 0.25-0.3 % GEL Place 1 drop into both eyes at bedtime.    . cholecalciferol (VITAMIN D) 1000 units tablet Take 1,000 Units by mouth daily.    Marland Kitchen  methocarbamol (ROBAXIN) 500 MG tablet Take 1 tablet (500 mg total) by mouth at bedtime as needed for muscle spasms. 5 tablet 0  . OVER THE COUNTER MEDICATION Place 1-2 drops into both eyes 5 (five) times daily as needed (for dry eyes.). Oasis Tears Plus  (Ingredients: Glycerin (0.22%), sodium chloride, sodium hyaluronate, sodium phosphate dibasic, sodium phosphate monobasic, water)    . Soft Lens Products (REWETTING DROPS) SOLN Place 1-2 drops into both eyes 5 (five) times daily as needed (for dry eyes (contact lenses)).     No current facility-administered medications for this visit.   Allergies: Allergies  Allergen Reactions  . Amoxicillin Anaphylaxis, Shortness Of Breath and Swelling    Has patient had a PCN reaction causing immediate rash, facial/tongue/throat swelling, SOB or lightheadedness with hypotension: Yes Has patient had a PCN reaction causing severe rash involving mucus membranes or skin necrosis: No Has patient had a PCN reaction that required hospitalization No--was treated in ER Has patient had a PCN reaction occurring within the last 10 years: No If all of the above answers are "NO", then may proceed with Cephalosporin use.   Marland Kitchen Penicillins Anaphylaxis, Shortness Of Breath and Swelling    Has patient had a PCN reaction causing immediate rash, facial/tongue/throat swelling, SOB or lightheadedness with hypotension: Yes Has patient had a PCN reaction causing severe rash involving mucus membranes or skin necrosis: No Has patient had a PCN reaction that required hospitalization No--was treated in ER Has patient had a PCN reaction occurring within the last 10 years: No If all of the above answers are "NO", then may proceed with  Cephalosporin use.  [name] [Sulfamethoxazole-Trimethoprim] Anaphylaxis and Swelling    Facial & throat swelling.  . Codeine Nausea And Vomiting    Non stop vomiting  . Gluten Meal Diarrhea and Swelling    GLUTEN FOOD ALLERGY FACIAL SWELLING GI UPSET(DIARRHEA/STOMACH CRAMPS) ACNE  . Lactose Intolerance (Gi) Diarrhea    STOMACH CRAMPS/DIARRHEA  . Wheat Bran Diarrhea and Swelling    WHEAT FOOD ALLERGY FACIAL SWELLING GI UPSET(DIARRHEA/STOMACH CRAMPS) ACNE   . Latex Rash    Skin irritation/redness/cracked skin/bleeding   Social History: Social History   Socioeconomic History  . Marital status: Married    Spouse name: Not on file  . Number of children: Not on file  . Years of education: Not on file  . Highest education level: Not on file  Occupational History  . Not on file  Tobacco Use  . Smoking status: Never Smoker  . Smokeless tobacco: Never Used  Vaping Use  . Vaping Use: Never used  Substance and Sexual Activity  . Alcohol use: Yes    Comment: rarely  . Drug use: No  . Sexual activity: Not on file  Other Topics Concern  . Not on file  Social History Narrative  . Not on file   Social Determinants of Health   Financial Resource Strain:   . Difficulty of Paying Living Expenses: Not on file  Food Insecurity:   . Worried About Programme researcher, broadcasting/film/video in the Last Year: Not on file  . Ran Out of Food in the Last Year: Not on file  Transportation Needs:   . Lack of Transportation (Medical): Not on file  . Lack of Transportation (Non-Medical): Not on file  Physical Activity:   . Days of Exercise per Week: Not on file  . Minutes of Exercise per Session: Not on file  Stress:   . Feeling of Stress : Not on  file  Social Connections:   . Frequency of Communication with Friends and Family: Not on file  . Frequency of Social Gatherings with Friends and Family: Not on file  . Attends Religious Services: Not on file  . Active Member of Clubs or Organizations: Not on  file  . Attends Banker Meetings: Not on file  . Marital Status: Not on file   Lives in a ***. Smoking: *** Occupation: ***  Environmental HistorySurveyor, minerals in the house: Copywriter, advertising in the family room: {Blank single:19197::"yes","no"} Carpet in the bedroom: {Blank single:19197::"yes","no"} Heating: {Blank single:19197::"electric","gas"} Cooling: {Blank single:19197::"central","window"} Pet: {Blank single:19197::"yes ***","no"}  Family History: No family history on file. Problem                               Relation Asthma                                   *** Eczema                                *** Food allergy                          *** Allergic rhino conjunctivitis     ***  Review of Systems  Constitutional: Negative for appetite change, chills, fever and unexpected weight change.  HENT: Negative for congestion and rhinorrhea.   Eyes: Negative for itching.  Respiratory: Negative for cough, chest tightness, shortness of breath and wheezing.   Cardiovascular: Negative for chest pain.  Gastrointestinal: Negative for abdominal pain.  Genitourinary: Negative for difficulty urinating.  Skin: Negative for rash.  Neurological: Negative for headaches.   Objective: There were no vitals taken for this visit. There is no height or weight on file to calculate BMI. Physical Exam Vitals and nursing note reviewed.  Constitutional:      Appearance: Normal appearance. She is well-developed.  HENT:     Head: Normocephalic and atraumatic.     Right Ear: External ear normal.     Left Ear: External ear normal.     Nose: Nose normal.     Mouth/Throat:     Mouth: Mucous membranes are moist.     Pharynx: Oropharynx is clear.  Eyes:     Conjunctiva/sclera: Conjunctivae normal.  Cardiovascular:     Rate and Rhythm: Normal rate and regular rhythm.     Heart sounds: Normal heart sounds. No murmur heard.  No friction rub. No gallop.    Pulmonary:     Effort: Pulmonary effort is normal.     Breath sounds: Normal breath sounds. No wheezing, rhonchi or rales.  Abdominal:     Palpations: Abdomen is soft.  Musculoskeletal:     Cervical back: Neck supple.  Skin:    General: Skin is warm.     Findings: No rash.  Neurological:     Mental Status: She is alert and oriented to person, place, and time.  Psychiatric:        Behavior: Behavior normal.    The plan was reviewed with the patient/family, and all questions/concerned were addressed.  It was my pleasure to see Saylee today and participate in her care. Please feel free to contact me with any questions or concerns.  Sincerely,  Rexene Alberts, DO Allergy & Immunology  [facility] office: [place] office: [phone]

## 2020-05-05 ENCOUNTER — Encounter: Payer: Self-pay | Admitting: Allergy

## 2020-05-16 ENCOUNTER — Other Ambulatory Visit: Payer: Self-pay | Admitting: General Surgery

## 2020-05-16 DIAGNOSIS — N6091 Unspecified benign mammary dysplasia of right breast: Secondary | ICD-10-CM

## 2020-05-18 DIAGNOSIS — H00031 Abscess of right upper eyelid: Secondary | ICD-10-CM | POA: Diagnosis not present

## 2020-05-20 DIAGNOSIS — H10413 Chronic giant papillary conjunctivitis, bilateral: Secondary | ICD-10-CM | POA: Diagnosis not present

## 2020-05-20 DIAGNOSIS — H04123 Dry eye syndrome of bilateral lacrimal glands: Secondary | ICD-10-CM | POA: Diagnosis not present

## 2020-05-20 DIAGNOSIS — H00031 Abscess of right upper eyelid: Secondary | ICD-10-CM | POA: Diagnosis not present

## 2020-05-20 DIAGNOSIS — H00032 Abscess of right lower eyelid: Secondary | ICD-10-CM | POA: Diagnosis not present

## 2020-05-23 DIAGNOSIS — H00011 Hordeolum externum right upper eyelid: Secondary | ICD-10-CM | POA: Diagnosis not present

## 2020-05-27 ENCOUNTER — Ambulatory Visit: Payer: Self-pay | Admitting: Allergy

## 2020-06-02 ENCOUNTER — Ambulatory Visit
Admission: RE | Admit: 2020-06-02 | Discharge: 2020-06-02 | Disposition: A | Discharge status: Home / Self Care | Payer: BLUE CROSS/BLUE SHIELD | Source: Ambulatory Visit | Attending: General Surgery | Admitting: General Surgery

## 2020-06-02 ENCOUNTER — Other Ambulatory Visit: Payer: Self-pay

## 2020-06-02 DIAGNOSIS — N6091 Unspecified benign mammary dysplasia of right breast: Secondary | ICD-10-CM

## 2020-06-02 DIAGNOSIS — N6489 Other specified disorders of breast: Secondary | ICD-10-CM | POA: Diagnosis not present

## 2020-06-02 MED ORDER — GADOBUTROL 1 MMOL/ML IV SOLN
7.0000 mL | Freq: Once | INTRAVENOUS | Status: AC | PRN
  Administered 2020-06-02: 7 mL via INTRAVENOUS

## 2020-06-16 DIAGNOSIS — N6091 Unspecified benign mammary dysplasia of right breast: Secondary | ICD-10-CM | POA: Diagnosis not present

## 2020-08-10 DIAGNOSIS — L84 Corns and callosities: Secondary | ICD-10-CM | POA: Diagnosis not present

## 2020-08-10 DIAGNOSIS — Z1283 Encounter for screening for malignant neoplasm of skin: Secondary | ICD-10-CM | POA: Diagnosis not present

## 2020-08-10 DIAGNOSIS — L821 Other seborrheic keratosis: Secondary | ICD-10-CM | POA: Diagnosis not present

## 2020-09-03 DIAGNOSIS — N765 Ulceration of vagina: Secondary | ICD-10-CM | POA: Diagnosis not present

## 2020-09-03 DIAGNOSIS — R300 Dysuria: Secondary | ICD-10-CM | POA: Diagnosis not present

## 2020-09-12 DIAGNOSIS — L292 Pruritus vulvae: Secondary | ICD-10-CM | POA: Diagnosis not present

## 2020-09-29 DIAGNOSIS — Z1151 Encounter for screening for human papillomavirus (HPV): Secondary | ICD-10-CM | POA: Diagnosis not present

## 2020-09-29 DIAGNOSIS — Z1231 Encounter for screening mammogram for malignant neoplasm of breast: Secondary | ICD-10-CM | POA: Diagnosis not present

## 2020-09-29 DIAGNOSIS — Z6827 Body mass index (BMI) 27.0-27.9, adult: Secondary | ICD-10-CM | POA: Diagnosis not present

## 2020-09-29 DIAGNOSIS — E559 Vitamin D deficiency, unspecified: Secondary | ICD-10-CM | POA: Diagnosis not present

## 2020-09-29 DIAGNOSIS — Z01419 Encounter for gynecological examination (general) (routine) without abnormal findings: Secondary | ICD-10-CM | POA: Diagnosis not present

## 2020-09-29 DIAGNOSIS — Z124 Encounter for screening for malignant neoplasm of cervix: Secondary | ICD-10-CM | POA: Diagnosis not present

## 2020-09-29 DIAGNOSIS — Z13220 Encounter for screening for lipoid disorders: Secondary | ICD-10-CM | POA: Diagnosis not present

## 2020-09-29 DIAGNOSIS — Z136 Encounter for screening for cardiovascular disorders: Secondary | ICD-10-CM | POA: Diagnosis not present

## 2020-09-29 DIAGNOSIS — Z1321 Encounter for screening for nutritional disorder: Secondary | ICD-10-CM | POA: Diagnosis not present

## 2020-10-03 DIAGNOSIS — R7309 Other abnormal glucose: Secondary | ICD-10-CM | POA: Diagnosis not present

## 2020-10-11 DIAGNOSIS — B356 Tinea cruris: Secondary | ICD-10-CM | POA: Diagnosis not present

## 2020-12-13 DIAGNOSIS — Z20828 Contact with and (suspected) exposure to other viral communicable diseases: Secondary | ICD-10-CM | POA: Diagnosis not present

## 2020-12-16 DIAGNOSIS — R509 Fever, unspecified: Secondary | ICD-10-CM | POA: Diagnosis not present

## 2020-12-16 DIAGNOSIS — Z20822 Contact with and (suspected) exposure to covid-19: Secondary | ICD-10-CM | POA: Diagnosis not present

## 2020-12-16 DIAGNOSIS — R112 Nausea with vomiting, unspecified: Secondary | ICD-10-CM | POA: Diagnosis not present

## 2020-12-17 DIAGNOSIS — R112 Nausea with vomiting, unspecified: Secondary | ICD-10-CM | POA: Diagnosis not present

## 2020-12-17 DIAGNOSIS — R509 Fever, unspecified: Secondary | ICD-10-CM | POA: Diagnosis not present

## 2020-12-17 DIAGNOSIS — Z03818 Encounter for observation for suspected exposure to other biological agents ruled out: Secondary | ICD-10-CM | POA: Diagnosis not present

## 2021-03-27 DIAGNOSIS — R7303 Prediabetes: Secondary | ICD-10-CM | POA: Diagnosis not present

## 2021-03-27 DIAGNOSIS — E7800 Pure hypercholesterolemia, unspecified: Secondary | ICD-10-CM | POA: Diagnosis not present

## 2021-06-27 DIAGNOSIS — L720 Epidermal cyst: Secondary | ICD-10-CM | POA: Diagnosis not present

## 2021-06-27 DIAGNOSIS — Z887 Allergy status to serum and vaccine status: Secondary | ICD-10-CM | POA: Diagnosis not present

## 2021-07-11 DIAGNOSIS — D171 Benign lipomatous neoplasm of skin and subcutaneous tissue of trunk: Secondary | ICD-10-CM | POA: Diagnosis not present

## 2021-07-21 DIAGNOSIS — H6122 Impacted cerumen, left ear: Secondary | ICD-10-CM | POA: Diagnosis not present

## 2021-08-09 DIAGNOSIS — D225 Melanocytic nevi of trunk: Secondary | ICD-10-CM | POA: Diagnosis not present

## 2021-08-09 DIAGNOSIS — Z1283 Encounter for screening for malignant neoplasm of skin: Secondary | ICD-10-CM | POA: Diagnosis not present

## 2021-10-05 DIAGNOSIS — Z833 Family history of diabetes mellitus: Secondary | ICD-10-CM | POA: Diagnosis not present

## 2021-10-05 DIAGNOSIS — Z13220 Encounter for screening for lipoid disorders: Secondary | ICD-10-CM | POA: Diagnosis not present

## 2021-10-05 DIAGNOSIS — Z01419 Encounter for gynecological examination (general) (routine) without abnormal findings: Secondary | ICD-10-CM | POA: Diagnosis not present

## 2021-10-05 DIAGNOSIS — Z1231 Encounter for screening mammogram for malignant neoplasm of breast: Secondary | ICD-10-CM | POA: Diagnosis not present

## 2021-10-05 DIAGNOSIS — Z130 Encounter for screening for diseases of the blood and blood-forming organs and certain disorders involving the immune mechanism: Secondary | ICD-10-CM | POA: Diagnosis not present

## 2021-10-05 DIAGNOSIS — Z1389 Encounter for screening for other disorder: Secondary | ICD-10-CM | POA: Diagnosis not present

## 2021-10-05 DIAGNOSIS — E559 Vitamin D deficiency, unspecified: Secondary | ICD-10-CM | POA: Diagnosis not present

## 2021-10-05 DIAGNOSIS — Z13228 Encounter for screening for other metabolic disorders: Secondary | ICD-10-CM | POA: Diagnosis not present

## 2021-10-25 DIAGNOSIS — R0981 Nasal congestion: Secondary | ICD-10-CM | POA: Diagnosis not present

## 2021-10-27 DIAGNOSIS — R7303 Prediabetes: Secondary | ICD-10-CM | POA: Diagnosis not present

## 2021-10-27 DIAGNOSIS — E663 Overweight: Secondary | ICD-10-CM | POA: Diagnosis not present

## 2022-03-07 ENCOUNTER — Other Ambulatory Visit: Payer: Self-pay | Admitting: Obstetrics and Gynecology

## 2022-03-07 DIAGNOSIS — N6099 Unspecified benign mammary dysplasia of unspecified breast: Secondary | ICD-10-CM

## 2022-03-07 DIAGNOSIS — N62 Hypertrophy of breast: Secondary | ICD-10-CM

## 2022-03-16 DIAGNOSIS — E7800 Pure hypercholesterolemia, unspecified: Secondary | ICD-10-CM | POA: Diagnosis not present

## 2022-03-16 DIAGNOSIS — R7303 Prediabetes: Secondary | ICD-10-CM | POA: Diagnosis not present

## 2022-03-16 DIAGNOSIS — Z0000 Encounter for general adult medical examination without abnormal findings: Secondary | ICD-10-CM | POA: Diagnosis not present

## 2022-03-16 DIAGNOSIS — Z23 Encounter for immunization: Secondary | ICD-10-CM | POA: Diagnosis not present

## 2022-03-26 ENCOUNTER — Other Ambulatory Visit: Payer: BC Managed Care – PPO

## 2022-03-26 ENCOUNTER — Ambulatory Visit
Admission: RE | Admit: 2022-03-26 | Discharge: 2022-03-26 | Disposition: A | Discharge status: Home / Self Care | Payer: BC Managed Care – PPO | Source: Ambulatory Visit | Attending: Obstetrics and Gynecology | Admitting: Obstetrics and Gynecology

## 2022-03-26 DIAGNOSIS — N62 Hypertrophy of breast: Secondary | ICD-10-CM

## 2022-03-26 DIAGNOSIS — N6081 Other benign mammary dysplasias of right breast: Secondary | ICD-10-CM | POA: Diagnosis not present

## 2022-03-26 DIAGNOSIS — N6099 Unspecified benign mammary dysplasia of unspecified breast: Secondary | ICD-10-CM

## 2022-03-26 MED ORDER — GADOBUTROL 1 MMOL/ML IV SOLN
7.0000 mL | Freq: Once | INTRAVENOUS | Status: AC | PRN
  Administered 2022-03-26: 7 mL via INTRAVENOUS

## 2022-03-28 ENCOUNTER — Other Ambulatory Visit: Payer: Self-pay | Admitting: Obstetrics and Gynecology

## 2022-03-28 DIAGNOSIS — R9389 Abnormal findings on diagnostic imaging of other specified body structures: Secondary | ICD-10-CM

## 2022-04-09 DIAGNOSIS — R059 Cough, unspecified: Secondary | ICD-10-CM | POA: Diagnosis not present

## 2022-04-09 DIAGNOSIS — J019 Acute sinusitis, unspecified: Secondary | ICD-10-CM | POA: Diagnosis not present

## 2022-04-09 DIAGNOSIS — N63 Unspecified lump in unspecified breast: Secondary | ICD-10-CM | POA: Diagnosis not present

## 2022-04-11 ENCOUNTER — Ambulatory Visit
Admission: RE | Admit: 2022-04-11 | Discharge: 2022-04-11 | Disposition: A | Discharge status: Home / Self Care | Payer: BC Managed Care – PPO | Source: Ambulatory Visit | Attending: Obstetrics and Gynecology | Admitting: Obstetrics and Gynecology

## 2022-04-11 DIAGNOSIS — R9389 Abnormal findings on diagnostic imaging of other specified body structures: Secondary | ICD-10-CM

## 2022-04-11 DIAGNOSIS — R928 Other abnormal and inconclusive findings on diagnostic imaging of breast: Secondary | ICD-10-CM | POA: Diagnosis not present

## 2022-04-11 DIAGNOSIS — N6012 Diffuse cystic mastopathy of left breast: Secondary | ICD-10-CM | POA: Diagnosis not present

## 2022-04-11 MED ORDER — GADOBUTROL 1 MMOL/ML IV SOLN
7.0000 mL | Freq: Once | INTRAVENOUS | Status: AC | PRN
  Administered 2022-04-11: 7 mL via INTRAVENOUS

## 2022-08-08 DIAGNOSIS — D225 Melanocytic nevi of trunk: Secondary | ICD-10-CM | POA: Diagnosis not present

## 2022-08-08 DIAGNOSIS — Z1283 Encounter for screening for malignant neoplasm of skin: Secondary | ICD-10-CM | POA: Diagnosis not present

## 2022-08-20 DIAGNOSIS — U071 COVID-19: Secondary | ICD-10-CM | POA: Diagnosis not present

## 2022-09-07 ENCOUNTER — Other Ambulatory Visit: Payer: Self-pay | Admitting: General Surgery

## 2022-09-07 DIAGNOSIS — N6099 Unspecified benign mammary dysplasia of unspecified breast: Secondary | ICD-10-CM

## 2022-09-07 DIAGNOSIS — N62 Hypertrophy of breast: Secondary | ICD-10-CM

## 2022-10-08 DIAGNOSIS — Z01419 Encounter for gynecological examination (general) (routine) without abnormal findings: Secondary | ICD-10-CM | POA: Diagnosis not present

## 2022-10-08 DIAGNOSIS — Z131 Encounter for screening for diabetes mellitus: Secondary | ICD-10-CM | POA: Diagnosis not present

## 2022-10-08 DIAGNOSIS — Z13228 Encounter for screening for other metabolic disorders: Secondary | ICD-10-CM | POA: Diagnosis not present

## 2022-10-08 DIAGNOSIS — Z1389 Encounter for screening for other disorder: Secondary | ICD-10-CM | POA: Diagnosis not present

## 2022-10-08 DIAGNOSIS — Z130 Encounter for screening for diseases of the blood and blood-forming organs and certain disorders involving the immune mechanism: Secondary | ICD-10-CM | POA: Diagnosis not present

## 2022-10-08 DIAGNOSIS — Z1231 Encounter for screening mammogram for malignant neoplasm of breast: Secondary | ICD-10-CM | POA: Diagnosis not present

## 2022-10-08 DIAGNOSIS — Z13220 Encounter for screening for lipoid disorders: Secondary | ICD-10-CM | POA: Diagnosis not present

## 2022-10-09 ENCOUNTER — Inpatient Hospital Stay: Admission: RE | Admit: 2022-10-09 | Payer: BC Managed Care – PPO | Source: Ambulatory Visit

## 2022-11-02 ENCOUNTER — Ambulatory Visit
Admission: RE | Admit: 2022-11-02 | Discharge: 2022-11-02 | Disposition: A | Discharge status: Home / Self Care | Payer: BC Managed Care – PPO | Source: Ambulatory Visit | Attending: General Surgery | Admitting: General Surgery

## 2022-11-02 DIAGNOSIS — N62 Hypertrophy of breast: Secondary | ICD-10-CM

## 2022-11-02 DIAGNOSIS — N6489 Other specified disorders of breast: Secondary | ICD-10-CM | POA: Diagnosis not present

## 2022-11-02 DIAGNOSIS — N6099 Unspecified benign mammary dysplasia of unspecified breast: Secondary | ICD-10-CM

## 2022-11-02 MED ORDER — GADOPICLENOL 0.5 MMOL/ML IV SOLN
7.0000 mL | Freq: Once | INTRAVENOUS | Status: AC | PRN
  Administered 2022-11-02: 7 mL via INTRAVENOUS

## 2023-04-19 DIAGNOSIS — E7800 Pure hypercholesterolemia, unspecified: Secondary | ICD-10-CM | POA: Diagnosis not present

## 2023-04-19 DIAGNOSIS — Z0000 Encounter for general adult medical examination without abnormal findings: Secondary | ICD-10-CM | POA: Diagnosis not present

## 2023-04-19 DIAGNOSIS — R7303 Prediabetes: Secondary | ICD-10-CM | POA: Diagnosis not present

## 2023-05-26 DIAGNOSIS — J329 Chronic sinusitis, unspecified: Secondary | ICD-10-CM | POA: Diagnosis not present

## 2023-05-26 DIAGNOSIS — R509 Fever, unspecified: Secondary | ICD-10-CM | POA: Diagnosis not present

## 2023-05-26 DIAGNOSIS — J40 Bronchitis, not specified as acute or chronic: Secondary | ICD-10-CM | POA: Diagnosis not present

## 2023-05-26 DIAGNOSIS — R051 Acute cough: Secondary | ICD-10-CM | POA: Diagnosis not present

## 2023-08-06 DIAGNOSIS — H6123 Impacted cerumen, bilateral: Secondary | ICD-10-CM | POA: Diagnosis not present

## 2023-08-19 DIAGNOSIS — D225 Melanocytic nevi of trunk: Secondary | ICD-10-CM | POA: Diagnosis not present

## 2023-08-19 DIAGNOSIS — Z1283 Encounter for screening for malignant neoplasm of skin: Secondary | ICD-10-CM | POA: Diagnosis not present

## 2023-09-11 DIAGNOSIS — J019 Acute sinusitis, unspecified: Secondary | ICD-10-CM | POA: Diagnosis not present

## 2023-10-08 DIAGNOSIS — Z1231 Encounter for screening mammogram for malignant neoplasm of breast: Secondary | ICD-10-CM | POA: Diagnosis not present

## 2023-10-08 DIAGNOSIS — Z13228 Encounter for screening for other metabolic disorders: Secondary | ICD-10-CM | POA: Diagnosis not present

## 2023-10-08 DIAGNOSIS — Z130 Encounter for screening for diseases of the blood and blood-forming organs and certain disorders involving the immune mechanism: Secondary | ICD-10-CM | POA: Diagnosis not present

## 2023-10-08 DIAGNOSIS — Z13220 Encounter for screening for lipoid disorders: Secondary | ICD-10-CM | POA: Diagnosis not present

## 2023-10-08 DIAGNOSIS — E559 Vitamin D deficiency, unspecified: Secondary | ICD-10-CM | POA: Diagnosis not present

## 2023-10-08 DIAGNOSIS — E785 Hyperlipidemia, unspecified: Secondary | ICD-10-CM | POA: Diagnosis not present

## 2023-10-08 DIAGNOSIS — R7303 Prediabetes: Secondary | ICD-10-CM | POA: Diagnosis not present

## 2023-10-08 DIAGNOSIS — Z01419 Encounter for gynecological examination (general) (routine) without abnormal findings: Secondary | ICD-10-CM | POA: Diagnosis not present

## 2023-10-08 DIAGNOSIS — Z1321 Encounter for screening for nutritional disorder: Secondary | ICD-10-CM | POA: Diagnosis not present

## 2024-02-19 ENCOUNTER — Other Ambulatory Visit: Payer: Self-pay | Admitting: General Surgery

## 2024-02-19 DIAGNOSIS — Z1239 Encounter for other screening for malignant neoplasm of breast: Secondary | ICD-10-CM

## 2024-03-14 ENCOUNTER — Ambulatory Visit
Admission: RE | Admit: 2024-03-14 | Discharge: 2024-03-14 | Disposition: A | Discharge status: Home / Self Care | Source: Ambulatory Visit | Attending: General Surgery | Admitting: General Surgery

## 2024-03-14 DIAGNOSIS — Z1239 Encounter for other screening for malignant neoplasm of breast: Secondary | ICD-10-CM | POA: Diagnosis not present

## 2024-03-14 MED ORDER — GADOPICLENOL 0.5 MMOL/ML IV SOLN
6.5000 mL | Freq: Once | INTRAVENOUS | Status: AC | PRN
  Administered 2024-03-14: 6.5 mL via INTRAVENOUS

## 2024-04-21 DIAGNOSIS — Z0000 Encounter for general adult medical examination without abnormal findings: Secondary | ICD-10-CM | POA: Diagnosis not present

## 2024-04-21 DIAGNOSIS — R7303 Prediabetes: Secondary | ICD-10-CM | POA: Diagnosis not present

## 2024-04-21 DIAGNOSIS — E7800 Pure hypercholesterolemia, unspecified: Secondary | ICD-10-CM | POA: Diagnosis not present

## 2024-06-16 DIAGNOSIS — J209 Acute bronchitis, unspecified: Secondary | ICD-10-CM | POA: Diagnosis not present
